# Patient Record
Sex: Female | Born: 1965 | Race: Black or African American | Hispanic: No | Marital: Single | Smoking: Never smoker
Health system: Southern US, Community
[De-identification: ages and names within clinical notes are randomized; demographics above are authoritative.]

## PROBLEM LIST (undated history)

## (undated) DIAGNOSIS — K219 Gastro-esophageal reflux disease without esophagitis: Secondary | ICD-10-CM

## (undated) DIAGNOSIS — I10 Essential (primary) hypertension: Secondary | ICD-10-CM

## (undated) DIAGNOSIS — J45909 Unspecified asthma, uncomplicated: Secondary | ICD-10-CM

## (undated) HISTORY — PX: TUBAL LIGATION: SHX77

## (undated) HISTORY — PX: NO PAST SURGERIES: SHX2092

---

## 1998-02-25 ENCOUNTER — Ambulatory Visit (HOSPITAL_COMMUNITY): Admission: RE | Admit: 1998-02-25 | Discharge: 1998-02-25 | Payer: Self-pay | Admitting: Obstetrics and Gynecology

## 1998-04-01 ENCOUNTER — Ambulatory Visit (HOSPITAL_COMMUNITY): Admission: RE | Admit: 1998-04-01 | Discharge: 1998-04-01 | Payer: Self-pay | Admitting: Obstetrics and Gynecology

## 1998-04-10 ENCOUNTER — Other Ambulatory Visit: Admission: RE | Admit: 1998-04-10 | Discharge: 1998-04-10 | Payer: Self-pay | Admitting: Obstetrics and Gynecology

## 1998-07-07 ENCOUNTER — Other Ambulatory Visit: Admission: RE | Admit: 1998-07-07 | Discharge: 1998-07-07 | Payer: Self-pay | Admitting: Obstetrics and Gynecology

## 1998-07-20 ENCOUNTER — Inpatient Hospital Stay (HOSPITAL_COMMUNITY): Admission: AD | Admit: 1998-07-20 | Discharge: 1998-07-20 | Payer: Self-pay | Admitting: Obstetrics and Gynecology

## 1998-07-21 ENCOUNTER — Inpatient Hospital Stay (HOSPITAL_COMMUNITY): Admission: AD | Admit: 1998-07-21 | Discharge: 1998-07-23 | Payer: Self-pay | Admitting: Obstetrics and Gynecology

## 1998-09-09 ENCOUNTER — Inpatient Hospital Stay (HOSPITAL_COMMUNITY): Admission: AD | Admit: 1998-09-09 | Discharge: 1998-09-09 | Payer: Self-pay | Admitting: Obstetrics and Gynecology

## 1999-02-02 ENCOUNTER — Other Ambulatory Visit: Admission: RE | Admit: 1999-02-02 | Discharge: 1999-02-02 | Payer: Self-pay | Admitting: Family Medicine

## 1999-08-13 ENCOUNTER — Emergency Department (HOSPITAL_COMMUNITY): Admission: EM | Admit: 1999-08-13 | Discharge: 1999-08-13 | Payer: Self-pay | Admitting: Emergency Medicine

## 2001-02-09 ENCOUNTER — Emergency Department (HOSPITAL_COMMUNITY): Admission: EM | Admit: 2001-02-09 | Discharge: 2001-02-10 | Payer: Self-pay | Admitting: Emergency Medicine

## 2001-10-30 ENCOUNTER — Other Ambulatory Visit: Admission: RE | Admit: 2001-10-30 | Discharge: 2001-10-30 | Payer: Self-pay | Admitting: *Deleted

## 2001-11-01 ENCOUNTER — Encounter: Admission: RE | Admit: 2001-11-01 | Discharge: 2001-11-01 | Payer: Self-pay | Admitting: *Deleted

## 2001-11-01 ENCOUNTER — Encounter: Payer: Self-pay | Admitting: *Deleted

## 2005-02-21 ENCOUNTER — Other Ambulatory Visit: Admission: RE | Admit: 2005-02-21 | Discharge: 2005-02-21 | Payer: Self-pay | Admitting: Obstetrics and Gynecology

## 2005-06-21 ENCOUNTER — Other Ambulatory Visit: Admission: RE | Admit: 2005-06-21 | Discharge: 2005-06-21 | Payer: Self-pay | Admitting: Obstetrics and Gynecology

## 2005-12-23 ENCOUNTER — Encounter: Admission: RE | Admit: 2005-12-23 | Discharge: 2005-12-23 | Payer: Self-pay | Admitting: Family Medicine

## 2011-08-30 ENCOUNTER — Inpatient Hospital Stay (INDEPENDENT_AMBULATORY_CARE_PROVIDER_SITE_OTHER)
Admission: RE | Admit: 2011-08-30 | Discharge: 2011-08-30 | Disposition: A | Discharge status: Home / Self Care | Payer: Self-pay | Source: Ambulatory Visit | Attending: Family Medicine | Admitting: Family Medicine

## 2011-08-30 DIAGNOSIS — M62838 Other muscle spasm: Secondary | ICD-10-CM

## 2011-08-30 DIAGNOSIS — S8000XA Contusion of unspecified knee, initial encounter: Secondary | ICD-10-CM

## 2011-09-15 ENCOUNTER — Inpatient Hospital Stay (INDEPENDENT_AMBULATORY_CARE_PROVIDER_SITE_OTHER)
Admission: RE | Admit: 2011-09-15 | Discharge: 2011-09-15 | Disposition: A | Discharge status: Home / Self Care | Payer: Self-pay | Source: Ambulatory Visit | Attending: Family Medicine | Admitting: Family Medicine

## 2011-09-15 DIAGNOSIS — M542 Cervicalgia: Secondary | ICD-10-CM

## 2011-11-02 ENCOUNTER — Other Ambulatory Visit: Payer: Self-pay | Admitting: Obstetrics and Gynecology

## 2011-11-02 DIAGNOSIS — N63 Unspecified lump in unspecified breast: Secondary | ICD-10-CM

## 2011-11-16 ENCOUNTER — Ambulatory Visit
Admission: RE | Admit: 2011-11-16 | Discharge: 2011-11-16 | Disposition: A | Discharge status: Home / Self Care | Payer: 59 | Source: Ambulatory Visit | Attending: Obstetrics and Gynecology | Admitting: Obstetrics and Gynecology

## 2011-11-16 DIAGNOSIS — N63 Unspecified lump in unspecified breast: Secondary | ICD-10-CM

## 2012-01-25 ENCOUNTER — Other Ambulatory Visit: Payer: Self-pay | Admitting: Neurosurgery

## 2012-01-25 DIAGNOSIS — S129XXA Fracture of neck, unspecified, initial encounter: Secondary | ICD-10-CM

## 2012-01-31 ENCOUNTER — Other Ambulatory Visit: Payer: Self-pay | Admitting: Obstetrics and Gynecology

## 2012-01-31 DIAGNOSIS — Z1231 Encounter for screening mammogram for malignant neoplasm of breast: Secondary | ICD-10-CM

## 2012-02-13 ENCOUNTER — Ambulatory Visit: Payer: Managed Care, Other (non HMO)

## 2012-03-01 ENCOUNTER — Ambulatory Visit
Admission: RE | Admit: 2012-03-01 | Discharge: 2012-03-01 | Disposition: A | Discharge status: Home / Self Care | Payer: Managed Care, Other (non HMO) | Source: Ambulatory Visit | Attending: Obstetrics and Gynecology | Admitting: Obstetrics and Gynecology

## 2012-03-01 DIAGNOSIS — Z1231 Encounter for screening mammogram for malignant neoplasm of breast: Secondary | ICD-10-CM

## 2012-04-06 ENCOUNTER — Other Ambulatory Visit: Payer: Managed Care, Other (non HMO)

## 2012-05-26 ENCOUNTER — Encounter (HOSPITAL_COMMUNITY): Payer: Self-pay | Admitting: Emergency Medicine

## 2012-05-26 ENCOUNTER — Emergency Department (HOSPITAL_COMMUNITY)
Admission: EM | Admit: 2012-05-26 | Discharge: 2012-05-27 | Disposition: A | Discharge status: Home / Self Care | Payer: Managed Care, Other (non HMO) | Attending: Emergency Medicine | Admitting: Emergency Medicine

## 2012-05-26 DIAGNOSIS — R1110 Vomiting, unspecified: Secondary | ICD-10-CM

## 2012-05-26 DIAGNOSIS — E860 Dehydration: Secondary | ICD-10-CM | POA: Insufficient documentation

## 2012-05-26 DIAGNOSIS — R197 Diarrhea, unspecified: Secondary | ICD-10-CM | POA: Insufficient documentation

## 2012-05-26 DIAGNOSIS — E876 Hypokalemia: Secondary | ICD-10-CM | POA: Insufficient documentation

## 2012-05-26 DIAGNOSIS — R29898 Other symptoms and signs involving the musculoskeletal system: Secondary | ICD-10-CM | POA: Insufficient documentation

## 2012-05-26 DIAGNOSIS — R112 Nausea with vomiting, unspecified: Secondary | ICD-10-CM | POA: Insufficient documentation

## 2012-05-26 DIAGNOSIS — R509 Fever, unspecified: Secondary | ICD-10-CM | POA: Insufficient documentation

## 2012-05-26 DIAGNOSIS — R209 Unspecified disturbances of skin sensation: Secondary | ICD-10-CM | POA: Insufficient documentation

## 2012-05-26 DIAGNOSIS — I10 Essential (primary) hypertension: Secondary | ICD-10-CM | POA: Insufficient documentation

## 2012-05-26 HISTORY — DX: Essential (primary) hypertension: I10

## 2012-05-26 HISTORY — DX: Gastro-esophageal reflux disease without esophagitis: K21.9

## 2012-05-26 NOTE — ED Notes (Signed)
Pt c/o n/v x 3 since 0100/d + 10 episodes since 0100. Pt states feeling weakness and unable to keep anything down.

## 2012-05-26 NOTE — ED Notes (Signed)
Pt c/o NVD that began late last Friday night. Pt states she went out with friends and had a few drinks as well as some wings. Pt states she woke up in the middle of the night with diarrhea. Vomiting began Saturday afternoon. Pt states she feels weak. Pt has not been able to eat today due to nausea but can keep fluids down.

## 2012-05-27 LAB — POCT I-STAT TROPONIN I: Troponin i, poc: 0 ng/mL (ref 0.00–0.08)

## 2012-05-27 LAB — CBC
Platelets: 236 10*3/uL (ref 150–400)
RBC: 4.39 MIL/uL (ref 3.87–5.11)
WBC: 9.7 10*3/uL (ref 4.0–10.5)

## 2012-05-27 LAB — DIFFERENTIAL
Eosinophils Absolute: 0.1 10*3/uL (ref 0.0–0.7)
Lymphocytes Relative: 12 % (ref 12–46)
Lymphs Abs: 1.2 10*3/uL (ref 0.7–4.0)
Neutrophils Relative %: 80 % — ABNORMAL HIGH (ref 43–77)

## 2012-05-27 LAB — BASIC METABOLIC PANEL
CO2: 21 mEq/L (ref 19–32)
GFR calc non Af Amer: 87 mL/min — ABNORMAL LOW (ref >90)
Glucose, Bld: 110 mg/dL — ABNORMAL HIGH (ref 70–99)
Potassium: 3.1 mEq/L — ABNORMAL LOW (ref 3.5–5.1)
Sodium: 138 mEq/L (ref 135–145)

## 2012-05-27 MED ORDER — SODIUM CHLORIDE 0.9 % IV BOLUS (SEPSIS)
1000.0000 mL | Freq: Once | INTRAVENOUS | Status: AC
  Administered 2012-05-27: 1000 mL via INTRAVENOUS

## 2012-05-27 MED ORDER — PROMETHAZINE HCL 25 MG PO TABS: 25.0000 mg | ORAL_TABLET | Freq: Four times a day (QID) | ORAL | Status: DC | PRN

## 2012-05-27 MED ORDER — ONDANSETRON HCL 4 MG/2ML IJ SOLN
4.0000 mg | Freq: Once | INTRAMUSCULAR | Status: AC
  Administered 2012-05-27: 4 mg via INTRAVENOUS
  Filled 2012-05-27: qty 2

## 2012-05-27 MED ORDER — LOPERAMIDE HCL 2 MG PO CAPS
4.0000 mg | ORAL_CAPSULE | Freq: Once | ORAL | Status: AC
  Administered 2012-05-27: 4 mg via ORAL
  Filled 2012-05-27: qty 2

## 2012-05-27 MED ORDER — POTASSIUM CHLORIDE ER 10 MEQ PO TBCR: 10.0000 meq | EXTENDED_RELEASE_TABLET | Freq: Two times a day (BID) | ORAL | Status: DC

## 2012-05-27 MED ORDER — SODIUM CHLORIDE 0.9 % IV SOLN
INTRAVENOUS | Status: DC
  Administered 2012-05-27 (×2): 125 mL/h via INTRAVENOUS

## 2012-05-27 MED ORDER — POTASSIUM CHLORIDE CRYS ER 20 MEQ PO TBCR
40.0000 meq | EXTENDED_RELEASE_TABLET | Freq: Once | ORAL | Status: AC
  Administered 2012-05-27: 40 meq via ORAL
  Filled 2012-05-27: qty 2

## 2012-05-27 NOTE — Discharge Instructions (Signed)
Drink plenty of fluids (clear liquids) the next 12-24 hours then start the BRAT diet. . Use the phenergan for nausea or vomiting. Take imodium OTC for diarrhea. Avoid mild products until the diarrhea is gone. Recheck if you get worse.  
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
## 2013-04-19 ENCOUNTER — Other Ambulatory Visit: Payer: Self-pay

## 2013-04-19 DIAGNOSIS — Z1231 Encounter for screening mammogram for malignant neoplasm of breast: Secondary | ICD-10-CM

## 2013-05-07 ENCOUNTER — Ambulatory Visit: Payer: Managed Care, Other (non HMO)

## 2013-05-09 ENCOUNTER — Ambulatory Visit
Admission: RE | Admit: 2013-05-09 | Discharge: 2013-05-09 | Disposition: A | Discharge status: Home / Self Care | Payer: Managed Care, Other (non HMO) | Source: Ambulatory Visit

## 2013-05-09 DIAGNOSIS — Z1231 Encounter for screening mammogram for malignant neoplasm of breast: Secondary | ICD-10-CM

## 2013-12-13 ENCOUNTER — Encounter (HOSPITAL_COMMUNITY): Payer: Self-pay | Admitting: Emergency Medicine

## 2013-12-13 ENCOUNTER — Emergency Department (HOSPITAL_COMMUNITY)
Admission: EM | Admit: 2013-12-13 | Discharge: 2013-12-14 | Disposition: A | Discharge status: Home / Self Care | Payer: Managed Care, Other (non HMO) | Attending: Emergency Medicine | Admitting: Emergency Medicine

## 2013-12-13 ENCOUNTER — Emergency Department (HOSPITAL_COMMUNITY): Payer: Managed Care, Other (non HMO)

## 2013-12-13 DIAGNOSIS — S0993XA Unspecified injury of face, initial encounter: Secondary | ICD-10-CM | POA: Diagnosis not present

## 2013-12-13 DIAGNOSIS — R42 Dizziness and giddiness: Secondary | ICD-10-CM | POA: Insufficient documentation

## 2013-12-13 DIAGNOSIS — K219 Gastro-esophageal reflux disease without esophagitis: Secondary | ICD-10-CM | POA: Diagnosis not present

## 2013-12-13 DIAGNOSIS — I10 Essential (primary) hypertension: Secondary | ICD-10-CM | POA: Insufficient documentation

## 2013-12-13 DIAGNOSIS — Z79899 Other long term (current) drug therapy: Secondary | ICD-10-CM | POA: Insufficient documentation

## 2013-12-13 DIAGNOSIS — Y92410 Unspecified street and highway as the place of occurrence of the external cause: Secondary | ICD-10-CM | POA: Insufficient documentation

## 2013-12-13 DIAGNOSIS — S139XXA Sprain of joints and ligaments of unspecified parts of neck, initial encounter: Secondary | ICD-10-CM | POA: Insufficient documentation

## 2013-12-13 DIAGNOSIS — Y9389 Activity, other specified: Secondary | ICD-10-CM | POA: Insufficient documentation

## 2013-12-13 DIAGNOSIS — IMO0002 Reserved for concepts with insufficient information to code with codable children: Secondary | ICD-10-CM | POA: Diagnosis present

## 2013-12-13 DIAGNOSIS — S161XXA Strain of muscle, fascia and tendon at neck level, initial encounter: Secondary | ICD-10-CM

## 2013-12-13 MED ORDER — HYDROCODONE-ACETAMINOPHEN 5-325 MG PO TABS: 1.0000 | ORAL_TABLET | Freq: Four times a day (QID) | ORAL | Status: DC | PRN

## 2013-12-13 MED ORDER — DIAZEPAM 5 MG PO TABS
10.0000 mg | ORAL_TABLET | Freq: Once | ORAL | Status: AC
  Administered 2013-12-13: 10 mg via ORAL
  Filled 2013-12-13: qty 2

## 2013-12-13 MED ORDER — METHOCARBAMOL 750 MG PO TABS: 750.0000 mg | ORAL_TABLET | Freq: Four times a day (QID) | ORAL | Status: DC | PRN

## 2013-12-13 MED ORDER — NAPROXEN 500 MG PO TABS: 500.0000 mg | ORAL_TABLET | Freq: Two times a day (BID) | ORAL | Status: DC | PRN
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
## 2013-12-14 NOTE — ED Provider Notes (Signed)
Medical screening examination/treatment/procedure(s) were performed by non-physician practitioner and as supervising physician I was immediately available for consultation/collaboration.  EKG Interpretation   None         Jeyden Coffelt N Kashonda Sarkisyan, DO 12/14/13 0033 

## 2014-01-17 ENCOUNTER — Other Ambulatory Visit: Payer: Self-pay | Admitting: Obstetrics and Gynecology

## 2014-01-17 DIAGNOSIS — N644 Mastodynia: Secondary | ICD-10-CM

## 2014-01-21 ENCOUNTER — Other Ambulatory Visit: Payer: Managed Care, Other (non HMO)

## 2014-02-06 ENCOUNTER — Ambulatory Visit
Admission: RE | Admit: 2014-02-06 | Discharge: 2014-02-06 | Disposition: A | Discharge status: Home / Self Care | Payer: Self-pay | Source: Ambulatory Visit | Attending: Obstetrics and Gynecology | Admitting: Obstetrics and Gynecology

## 2014-02-06 ENCOUNTER — Ambulatory Visit
Admission: RE | Admit: 2014-02-06 | Discharge: 2014-02-06 | Disposition: A | Discharge status: Home / Self Care | Payer: Managed Care, Other (non HMO) | Source: Ambulatory Visit | Attending: Obstetrics and Gynecology | Admitting: Obstetrics and Gynecology

## 2014-02-06 DIAGNOSIS — N644 Mastodynia: Secondary | ICD-10-CM

## 2014-08-01 ENCOUNTER — Other Ambulatory Visit: Payer: Self-pay

## 2014-08-01 DIAGNOSIS — Z1231 Encounter for screening mammogram for malignant neoplasm of breast: Secondary | ICD-10-CM

## 2014-08-06 ENCOUNTER — Ambulatory Visit
Admission: RE | Admit: 2014-08-06 | Discharge: 2014-08-06 | Disposition: A | Discharge status: Home / Self Care | Payer: Managed Care, Other (non HMO) | Source: Ambulatory Visit

## 2014-08-06 DIAGNOSIS — Z1231 Encounter for screening mammogram for malignant neoplasm of breast: Secondary | ICD-10-CM

## 2014-08-08 ENCOUNTER — Other Ambulatory Visit: Payer: Self-pay | Admitting: Obstetrics and Gynecology

## 2014-08-08 DIAGNOSIS — R928 Other abnormal and inconclusive findings on diagnostic imaging of breast: Secondary | ICD-10-CM

## 2014-08-18 ENCOUNTER — Ambulatory Visit
Admission: RE | Admit: 2014-08-18 | Discharge: 2014-08-18 | Disposition: A | Discharge status: Home / Self Care | Payer: Managed Care, Other (non HMO) | Source: Ambulatory Visit | Attending: Obstetrics and Gynecology | Admitting: Obstetrics and Gynecology

## 2014-08-18 DIAGNOSIS — R928 Other abnormal and inconclusive findings on diagnostic imaging of breast: Secondary | ICD-10-CM

## 2015-07-21 ENCOUNTER — Other Ambulatory Visit: Payer: Self-pay

## 2015-07-21 ENCOUNTER — Other Ambulatory Visit (HOSPITAL_COMMUNITY)
Admission: RE | Admit: 2015-07-21 | Discharge: 2015-07-21 | Disposition: A | Discharge status: Home / Self Care | Payer: BC Managed Care – PPO | Source: Ambulatory Visit | Attending: Internal Medicine | Admitting: Internal Medicine

## 2015-07-21 DIAGNOSIS — Z1231 Encounter for screening mammogram for malignant neoplasm of breast: Secondary | ICD-10-CM

## 2015-07-21 DIAGNOSIS — Z01419 Encounter for gynecological examination (general) (routine) without abnormal findings: Secondary | ICD-10-CM | POA: Diagnosis present

## 2015-07-22 ENCOUNTER — Other Ambulatory Visit: Payer: Self-pay | Admitting: Internal Medicine

## 2015-07-22 DIAGNOSIS — R10819 Abdominal tenderness, unspecified site: Secondary | ICD-10-CM

## 2015-07-28 ENCOUNTER — Ambulatory Visit
Admission: RE | Admit: 2015-07-28 | Discharge: 2015-07-28 | Disposition: A | Discharge status: Home / Self Care | Payer: BC Managed Care – PPO | Source: Ambulatory Visit | Attending: Internal Medicine | Admitting: Internal Medicine

## 2015-07-28 DIAGNOSIS — R10819 Abdominal tenderness, unspecified site: Secondary | ICD-10-CM

## 2015-08-25 ENCOUNTER — Ambulatory Visit: Payer: Managed Care, Other (non HMO)

## 2015-08-25 ENCOUNTER — Ambulatory Visit
Admission: RE | Admit: 2015-08-25 | Discharge: 2015-08-25 | Disposition: A | Discharge status: Home / Self Care | Payer: BC Managed Care – PPO | Source: Ambulatory Visit

## 2015-08-25 DIAGNOSIS — Z1231 Encounter for screening mammogram for malignant neoplasm of breast: Secondary | ICD-10-CM

## 2016-04-12 DIAGNOSIS — I10 Essential (primary) hypertension: Secondary | ICD-10-CM | POA: Insufficient documentation

## 2016-04-12 DIAGNOSIS — K219 Gastro-esophageal reflux disease without esophagitis: Secondary | ICD-10-CM | POA: Insufficient documentation

## 2016-04-14 ENCOUNTER — Ambulatory Visit (INDEPENDENT_AMBULATORY_CARE_PROVIDER_SITE_OTHER): Payer: BC Managed Care – PPO

## 2016-04-14 DIAGNOSIS — I10 Essential (primary) hypertension: Secondary | ICD-10-CM

## 2016-04-14 DIAGNOSIS — R079 Chest pain, unspecified: Secondary | ICD-10-CM

## 2016-04-14 LAB — EXERCISE TOLERANCE TEST
CHL CUP STRESS STAGE 1 DBP: 77 mmHg
CHL CUP STRESS STAGE 1 HR: 95 {beats}/min
CHL CUP STRESS STAGE 3 HR: 89 {beats}/min
CHL CUP STRESS STAGE 3 SPEED: 1 mph
CHL CUP STRESS STAGE 4 GRADE: 10 %
CHL CUP STRESS STAGE 4 HR: 134 {beats}/min
CHL CUP STRESS STAGE 4 SBP: 166 mmHg
CHL CUP STRESS STAGE 4 SPEED: 1.7 mph
CHL CUP STRESS STAGE 5 GRADE: 12 %
CHL CUP STRESS STAGE 5 HR: 155 {beats}/min
CHL CUP STRESS STAGE 6 HR: 173 {beats}/min
CHL CUP STRESS STAGE 7 SPEED: 0 mph
CHL CUP STRESS STAGE 8 GRADE: 0 %
CHL CUP STRESS STAGE 8 HR: 112 {beats}/min
Estimated workload: 10 METS
Exercise duration (min): 8 min
Exercise duration (sec): 0 s
MPHR: 172 {beats}/min
Peak HR: 173 {beats}/min
Percent HR: 100 %
Percent of predicted max HR: 100 %
RPE: 17
Rest HR: 78 {beats}/min
Stage 1 Grade: 0 %
Stage 1 SBP: 125 mmHg
Stage 1 Speed: 0 mph
Stage 2 Grade: 0 %
Stage 2 HR: 93 {beats}/min
Stage 2 Speed: 1 mph
Stage 3 Grade: 0 %
Stage 4 DBP: 104 mmHg
Stage 5 DBP: 106 mmHg
Stage 5 SBP: 178 mmHg
Stage 5 Speed: 2.5 mph
Stage 6 Grade: 14 %
Stage 6 Speed: 3.4 mph
Stage 7 Grade: 0 %
Stage 7 HR: 146 {beats}/min
Stage 8 DBP: 85 mmHg
Stage 8 SBP: 132 mmHg
Stage 8 Speed: 0 mph

## 2016-04-21 ENCOUNTER — Telehealth (HOSPITAL_COMMUNITY): Payer: Self-pay | Admitting: *Deleted

## 2016-04-21 ENCOUNTER — Ambulatory Visit (HOSPITAL_COMMUNITY): Payer: BC Managed Care – PPO

## 2016-04-21 NOTE — Telephone Encounter (Signed)
Left message on voicemail in reference to upcoming appointment scheduled for 04/26/16  Phone number given for a call back so details instructions can be given. Agapito Hanway J Nagee Goates, RN 

## 2016-04-26 ENCOUNTER — Other Ambulatory Visit (HOSPITAL_COMMUNITY): Payer: Self-pay | Admitting: Internal Medicine

## 2016-04-26 ENCOUNTER — Ambulatory Visit (HOSPITAL_COMMUNITY): Payer: BC Managed Care – PPO | Attending: Internal Medicine

## 2016-04-26 DIAGNOSIS — I10 Essential (primary) hypertension: Secondary | ICD-10-CM | POA: Diagnosis not present

## 2016-04-26 DIAGNOSIS — R9431 Abnormal electrocardiogram [ECG] [EKG]: Secondary | ICD-10-CM

## 2016-04-26 DIAGNOSIS — R079 Chest pain, unspecified: Secondary | ICD-10-CM | POA: Insufficient documentation

## 2016-04-26 LAB — MYOCARDIAL PERFUSION IMAGING
CHL CUP NUCLEAR SDS: 1
CHL CUP NUCLEAR SSS: 4
LV dias vol: 88 mL (ref 46–106)
LV sys vol: 36 mL
Peak HR: 115 {beats}/min
RATE: 0.32
Rest HR: 60 {beats}/min
SRS: 3
TID: 1.03

## 2016-04-26 MED ORDER — TECHNETIUM TC 99M SESTAMIBI GENERIC - CARDIOLITE
11.0000 | Freq: Once | INTRAVENOUS | Status: AC | PRN
  Administered 2016-04-26: 11 via INTRAVENOUS

## 2016-04-26 MED ORDER — TECHNETIUM TC 99M SESTAMIBI GENERIC - CARDIOLITE
32.6000 | Freq: Once | INTRAVENOUS | Status: AC | PRN
  Administered 2016-04-26: 33 via INTRAVENOUS

## 2016-04-26 MED ORDER — REGADENOSON 0.4 MG/5ML IV SOLN
0.4000 mg | Freq: Once | INTRAVENOUS | Status: AC
  Administered 2016-04-26: 0.4 mg via INTRAVENOUS

## 2016-05-26 ENCOUNTER — Other Ambulatory Visit: Payer: Self-pay | Admitting: Obstetrics and Gynecology

## 2016-05-27 LAB — CYTOLOGY - PAP

## 2016-07-15 ENCOUNTER — Other Ambulatory Visit: Payer: Self-pay | Admitting: Obstetrics and Gynecology

## 2016-07-15 DIAGNOSIS — Z1231 Encounter for screening mammogram for malignant neoplasm of breast: Secondary | ICD-10-CM

## 2016-08-25 ENCOUNTER — Ambulatory Visit
Admission: RE | Admit: 2016-08-25 | Discharge: 2016-08-25 | Disposition: A | Discharge status: Home / Self Care | Payer: BC Managed Care – PPO | Source: Ambulatory Visit | Attending: Obstetrics and Gynecology | Admitting: Obstetrics and Gynecology

## 2016-08-25 DIAGNOSIS — Z1231 Encounter for screening mammogram for malignant neoplasm of breast: Secondary | ICD-10-CM

## 2016-12-18 ENCOUNTER — Encounter (HOSPITAL_COMMUNITY): Payer: Self-pay

## 2016-12-18 ENCOUNTER — Ambulatory Visit (HOSPITAL_COMMUNITY)
Admission: EM | Admit: 2016-12-18 | Discharge: 2016-12-18 | Disposition: A | Discharge status: Home / Self Care | Payer: BC Managed Care – PPO | Attending: Family Medicine | Admitting: Family Medicine

## 2016-12-18 DIAGNOSIS — R197 Diarrhea, unspecified: Secondary | ICD-10-CM | POA: Diagnosis not present

## 2016-12-18 DIAGNOSIS — R05 Cough: Secondary | ICD-10-CM

## 2016-12-18 DIAGNOSIS — R059 Cough, unspecified: Secondary | ICD-10-CM

## 2016-12-18 DIAGNOSIS — R112 Nausea with vomiting, unspecified: Secondary | ICD-10-CM

## 2016-12-18 MED ORDER — ONDANSETRON 8 MG PO TBDP: 8.0000 mg | ORAL_TABLET | Freq: Three times a day (TID) | ORAL | 0 refills | Status: DC | PRN

## 2016-12-18 MED ORDER — HYDROCODONE-HOMATROPINE 5-1.5 MG/5ML PO SYRP: 5.0000 mL | ORAL_SOLUTION | Freq: Four times a day (QID) | ORAL | 0 refills | Status: DC | PRN

## 2016-12-18 MED ORDER — AZITHROMYCIN 250 MG PO TABS: 250.0000 mg | ORAL_TABLET | Freq: Every day | ORAL | 0 refills | Status: DC

## 2016-12-18 NOTE — ED Provider Notes (Signed)
[facility]    CSN: [id number] Arrival date & time: 12/18/16  1337     History   Chief Complaint Chief Complaint  Patient presents with  . Emesis    HPI Christy Schultz is a 50 y.o. female.   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 She was diagnosed recently with asthma although she's never had that before. She was given an inhaler.  She doesn't have much to eat last couple days is having some dry heaves.  Patient denies fever, rash, sore throat, or abdominal pain      Past Medical History:  Diagnosis Date  . GERD (gastroesophageal reflux disease)   . Hypertension     Patient Active Problem List   Diagnosis Date Noted  . Hypertension   . GERD (gastroesophageal reflux disease)     Past Surgical History:  Procedure Laterality Date  . NO PAST SURGERIES      OB History    No data available       Home Medications    Prior to Admission medications   Medication Sig Start Date End Date Taking? Authorizing Provider  lisinopril-hydrochlorothiazide (PRINZIDE,ZESTORETIC) 20-12.5 MG per tablet Take 1 tablet by mouth daily.   Yes Historical Provider, MD  omeprazole (PRILOSEC) 20 MG capsule Take 20 mg by mouth daily.   Yes Historical Provider, MD  azithromycin (ZITHROMAX) 250 MG tablet Take 1 tablet (250 mg total) by mouth daily. Take first 2 tablets together, then 1 every day until finished. 12/18/16   Elvina SidleKurt Karina Lenderman, MD  HYDROcodone-homatropine [facility](HYCODAN) 5-1.5 MG/5ML syrup Take 5 mLs by mouth every 6 (six) hours as needed for cough. 12/18/16   Elvina SidleKurt Kaleiah Kutzer, MD  ondansetron (ZOFRAN-ODT) 8 MG disintegrating tablet Take 1 tablet (8 mg total) by mouth every 8 (eight) hours as needed for nausea. 12/18/16   Elvina SidleKurt Bonnie Overdorf, MD  valACYclovir (VALTREX) 500 MG tablet Take 500 mg by mouth daily.    Historical Provider, MD    Family History No family history on file.  Social History Social  History  Substance Use Topics  . Smoking status: Never Smoker  . Smokeless tobacco: Never Used  . Alcohol use Yes     Comment: occassional     Allergies   Patient has no known allergies.   Review of Systems Review of Systems  Constitutional: Negative.   HENT: Negative.   Respiratory: Positive for cough.   Gastrointestinal: Positive for diarrhea, nausea and vomiting. Negative for abdominal pain.  Genitourinary: Negative.   Musculoskeletal: Positive for myalgias.  Neurological: Positive for headaches.     Physical Exam Triage Vital Signs ED Triage Vitals  Enc Vitals Group     BP 12/18/16 1350 133/91     Pulse Rate 12/18/16 1350 105     Resp 12/18/16 1350 16     Temp 12/18/16 1350 98.4 F (36.9 C)     Temp Source 12/18/16 1350 Oral     SpO2 12/18/16 1350 100 %     Weight --      Height --      Head Circumference --      Peak Flow --      Pain Score 12/18/16 1351 10     Pain Loc --      Pain Edu? --      Excl. in GC? --    No data found.   Updated Vital Signs BP 133/91 (BP Location: Left Arm)  Pulse 105   Temp 98.4 F (36.9 C) (Oral)   Resp 16   SpO2 100%    Physical Exam  Constitutional: She is oriented to person, place, and time. She appears well-developed and well-nourished.  HENT:  Head: Normocephalic.  Right Ear: External ear normal.  Left Ear: External ear normal.  Mouth/Throat: Oropharynx is clear and moist.  Eyes: Conjunctivae and EOM are normal.  Neck: Normal range of motion. Neck supple.  Cardiovascular: Normal rate, regular rhythm and normal heart sounds.   Pulmonary/Chest: Effort normal and breath sounds normal.  Abdominal: Soft. Bowel sounds are normal.  Musculoskeletal: Normal range of motion.  Neurological: She is alert and oriented to person, place, and time.  Skin: Skin is warm and dry.  Nursing note and vitals reviewed.    UC Treatments / Results  Labs (all labs ordered are listed, but only abnormal results are  displayed) Labs Reviewed - No data to display  EKG  EKG Interpretation None       Radiology No results found.  Procedures Procedures (including critical care time)  Medications Ordered in UC Medications - No data to display   Initial Impression / Assessment and Plan / UC Course  I have reviewed the triage vital signs and the nursing notes.  Pertinent labs & imaging results that were available during my care of the patient were reviewed by me and considered in my medical decision making (see chart for details).  Clinical Course     Final Clinical Impressions(s) / UC Diagnoses   Final diagnoses:  Nausea vomiting and diarrhea  Cough    New Prescriptions New Prescriptions   AZITHROMYCIN (ZITHROMAX) 250 MG TABLET    Take 1 tablet (250 mg total) by mouth daily. Take first 2 tablets together, then 1 every day until finished.   HYDROCODONE-HOMATROPINE (HYCODAN) 5-1.5 MG/5ML SYRUP    Take 5 mLs by mouth every 6 (six) hours as needed for cough.   ONDANSETRON (ZOFRAN-ODT) 8 MG DISINTEGRATING TABLET    Take 1 tablet (8 mg total) by mouth every 8 (eight) hours as needed for nausea.     Elvina SidleKurt Ahmeer Tuman, MD 12/18/16 [phone]

## 2016-12-18 NOTE — ED Triage Notes (Signed)
Pt said she has been feeling bad since wed. Has a real bad cough, diarrhea and vomitting. No fever reported. Was taking mucinex and cough medication.

## 2016-12-30 ENCOUNTER — Encounter (HOSPITAL_COMMUNITY): Payer: Self-pay | Admitting: Emergency Medicine

## 2016-12-30 ENCOUNTER — Emergency Department (HOSPITAL_COMMUNITY)
Admission: EM | Admit: 2016-12-30 | Discharge: 2016-12-30 | Disposition: A | Discharge status: Home / Self Care | Payer: No Typology Code available for payment source | Attending: Emergency Medicine | Admitting: Emergency Medicine

## 2016-12-30 DIAGNOSIS — J45909 Unspecified asthma, uncomplicated: Secondary | ICD-10-CM | POA: Insufficient documentation

## 2016-12-30 DIAGNOSIS — I10 Essential (primary) hypertension: Secondary | ICD-10-CM | POA: Insufficient documentation

## 2016-12-30 DIAGNOSIS — M542 Cervicalgia: Secondary | ICD-10-CM | POA: Diagnosis not present

## 2016-12-30 DIAGNOSIS — Y939 Activity, unspecified: Secondary | ICD-10-CM | POA: Insufficient documentation

## 2016-12-30 DIAGNOSIS — Y92410 Unspecified street and highway as the place of occurrence of the external cause: Secondary | ICD-10-CM | POA: Insufficient documentation

## 2016-12-30 DIAGNOSIS — Z79899 Other long term (current) drug therapy: Secondary | ICD-10-CM | POA: Insufficient documentation

## 2016-12-30 DIAGNOSIS — M7918 Myalgia, other site: Secondary | ICD-10-CM

## 2016-12-30 DIAGNOSIS — Y999 Unspecified external cause status: Secondary | ICD-10-CM | POA: Diagnosis not present

## 2016-12-30 HISTORY — DX: Unspecified asthma, uncomplicated: J45.909

## 2016-12-30 MED ORDER — IBUPROFEN 600 MG PO TABS: 600.0000 mg | ORAL_TABLET | Freq: Four times a day (QID) | ORAL | 0 refills | Status: DC | PRN

## 2016-12-30 MED ORDER — METHOCARBAMOL 500 MG PO TABS: 500.0000 mg | ORAL_TABLET | Freq: Two times a day (BID) | ORAL | 0 refills | Status: DC

## 2016-12-30 MED ORDER — METHOCARBAMOL 500 MG PO TABS
500.0000 mg | ORAL_TABLET | Freq: Once | ORAL | Status: AC
Start: 2016-12-30 — End: 2016-12-30
  Administered 2016-12-30: 500 mg via ORAL
  Filled 2016-12-30: qty 1

## 2016-12-30 MED ORDER — IBUPROFEN 200 MG PO TABS
600.0000 mg | ORAL_TABLET | Freq: Once | ORAL | Status: AC
  Administered 2016-12-30: 600 mg via ORAL
  Filled 2016-12-30: qty 3
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
## 2017-01-30 ENCOUNTER — Encounter (INDEPENDENT_AMBULATORY_CARE_PROVIDER_SITE_OTHER): Payer: Self-pay | Admitting: Orthopedic Surgery

## 2017-01-30 ENCOUNTER — Ambulatory Visit (INDEPENDENT_AMBULATORY_CARE_PROVIDER_SITE_OTHER): Payer: Self-pay

## 2017-01-30 ENCOUNTER — Ambulatory Visit (INDEPENDENT_AMBULATORY_CARE_PROVIDER_SITE_OTHER): Payer: BC Managed Care – PPO | Admitting: Orthopedic Surgery

## 2017-01-30 VITALS — Ht 66.0 in | Wt 230.0 lb

## 2017-01-30 DIAGNOSIS — M79641 Pain in right hand: Secondary | ICD-10-CM | POA: Insufficient documentation
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
## 2017-04-14 ENCOUNTER — Telehealth (INDEPENDENT_AMBULATORY_CARE_PROVIDER_SITE_OTHER): Payer: Self-pay | Admitting: Orthopedic Surgery

## 2017-04-14 NOTE — Telephone Encounter (Signed)
I received request for records from [employer] 03/28/17-but could not process as the authorization was missing patients signature.  I faxed back advising of this.

## 2017-07-28 ENCOUNTER — Other Ambulatory Visit: Payer: Self-pay | Admitting: Obstetrics and Gynecology

## 2017-07-28 DIAGNOSIS — Z1231 Encounter for screening mammogram for malignant neoplasm of breast: Secondary | ICD-10-CM

## 2017-08-30 ENCOUNTER — Ambulatory Visit
Admission: RE | Admit: 2017-08-30 | Discharge: 2017-08-30 | Disposition: A | Discharge status: Home / Self Care | Payer: BC Managed Care – PPO | Source: Ambulatory Visit | Attending: Obstetrics and Gynecology | Admitting: Obstetrics and Gynecology

## 2017-08-30 DIAGNOSIS — Z1231 Encounter for screening mammogram for malignant neoplasm of breast: Secondary | ICD-10-CM

## 2018-02-08 ENCOUNTER — Other Ambulatory Visit: Payer: Self-pay | Admitting: Internal Medicine

## 2018-02-08 DIAGNOSIS — N63 Unspecified lump in unspecified breast: Secondary | ICD-10-CM

## 2018-02-14 ENCOUNTER — Ambulatory Visit
Admission: RE | Admit: 2018-02-14 | Discharge: 2018-02-14 | Disposition: A | Discharge status: Home / Self Care | Payer: BC Managed Care – PPO | Source: Ambulatory Visit | Attending: Internal Medicine | Admitting: Internal Medicine

## 2018-02-14 DIAGNOSIS — N63 Unspecified lump in unspecified breast: Secondary | ICD-10-CM

## 2018-10-01 ENCOUNTER — Other Ambulatory Visit: Payer: Self-pay | Admitting: Obstetrics and Gynecology

## 2018-10-01 DIAGNOSIS — Z1231 Encounter for screening mammogram for malignant neoplasm of breast: Secondary | ICD-10-CM

## 2018-11-05 ENCOUNTER — Ambulatory Visit
Admission: RE | Admit: 2018-11-05 | Discharge: 2018-11-05 | Disposition: A | Discharge status: Home / Self Care | Payer: BC Managed Care – PPO | Source: Ambulatory Visit | Attending: Obstetrics and Gynecology | Admitting: Obstetrics and Gynecology

## 2018-11-05 DIAGNOSIS — Z1231 Encounter for screening mammogram for malignant neoplasm of breast: Secondary | ICD-10-CM

## 2019-06-05 ENCOUNTER — Encounter (HOSPITAL_COMMUNITY): Payer: Self-pay | Admitting: Emergency Medicine

## 2019-06-05 ENCOUNTER — Ambulatory Visit (HOSPITAL_COMMUNITY)
Admission: EM | Admit: 2019-06-05 | Discharge: 2019-06-05 | Disposition: A | Discharge status: Home / Self Care | Payer: BC Managed Care – PPO | Attending: Family Medicine | Admitting: Family Medicine

## 2019-06-05 ENCOUNTER — Other Ambulatory Visit: Payer: Self-pay

## 2019-06-05 DIAGNOSIS — S39012A Strain of muscle, fascia and tendon of lower back, initial encounter: Secondary | ICD-10-CM | POA: Diagnosis not present

## 2019-06-05 MED ORDER — DICLOFENAC SODIUM 75 MG PO TBEC: 75.0000 mg | DELAYED_RELEASE_TABLET | Freq: Two times a day (BID) | ORAL | 0 refills | Status: DC

## 2019-06-05 MED ORDER — CYCLOBENZAPRINE HCL 10 MG PO TABS: ORAL_TABLET | ORAL | 0 refills | Status: DC
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
## 2019-06-05 NOTE — ED Notes (Signed)
Patient able to ambulate independently  

## 2019-09-27 ENCOUNTER — Other Ambulatory Visit: Payer: Self-pay | Admitting: Obstetrics and Gynecology

## 2019-09-27 DIAGNOSIS — Z1231 Encounter for screening mammogram for malignant neoplasm of breast: Secondary | ICD-10-CM

## 2019-10-25 ENCOUNTER — Ambulatory Visit: Payer: BC Managed Care – PPO | Admitting: Cardiology

## 2019-10-25 ENCOUNTER — Encounter: Payer: Self-pay | Admitting: Cardiology

## 2019-10-25 ENCOUNTER — Other Ambulatory Visit: Payer: Self-pay

## 2019-10-25 VITALS — BP 131/81 | HR 69 | Temp 97.3°F | Ht 67.0 in | Wt 219.0 lb

## 2019-10-25 DIAGNOSIS — R0789 Other chest pain: Secondary | ICD-10-CM | POA: Diagnosis not present

## 2019-10-25 DIAGNOSIS — R002 Palpitations: Secondary | ICD-10-CM | POA: Diagnosis not present

## 2019-10-25 DIAGNOSIS — I10 Essential (primary) hypertension: Secondary | ICD-10-CM | POA: Diagnosis not present

## 2019-10-25 NOTE — Progress Notes (Signed)
Primary Physician/Referring:  Thressa Sheller, MD (Inactive)  Patient ID: Christy Schultz, female    DOB: 12-18-1966, 53 y.o.   MRN: [id number]  Chief Complaint  Patient presents with  . New Patient (Initial Visit)  . Palpitations    multiple comorbidities   HPI:    Christy Schultz  is a 54 y.o. African-American female with hypertension, mild obesity, who had COVID-19 in September 2020, she has since been tested and is negative, started having palpitations and chest tightness 2 weeks ago, described as lasting a few seconds, not necessarily associated with any exertional activity.  She has been exercising and jogging every day but stopped since this episode.  She is concerned about COVID-19 and effects on cardiovascular health.  Denies diabetes or hyperlipidemia, she is a non-smoker.  Past Medical History:  Diagnosis Date  . Asthma   . GERD (gastroesophageal reflux disease)   . Hypertension    Past Surgical History:  Procedure Laterality Date  . NO PAST SURGERIES    . TUBAL LIGATION     Social History   Socioeconomic History  . Marital status: Single    Spouse name: Not on file  . Number of children: 3  . Years of education: Not on file  . Highest education level: Not on file  Occupational History  . Not on file  Social Needs  . Financial resource strain: Not on file  . Food insecurity    Worry: Not on file    Inability: Not on file  . Transportation needs    Medical: Not on file    Non-medical: Not on file  Tobacco Use  . Smoking status: Never Smoker  . Smokeless tobacco: Never Used  Substance and Sexual Activity  . Alcohol use: Yes    Comment: occassional  . Drug use: No  . Sexual activity: Not on file  Lifestyle  . Physical activity    Days per week: Not on file    Minutes per session: Not on file  . Stress: Not on file  Relationships  . Social Herbalist on phone: Not on file    Gets together: Not on file    Attends religious service: Not on  file    Active member of club or organization: Not on file    Attends meetings of clubs or organizations: Not on file    Relationship status: Not on file  . Intimate partner violence    Fear of current or ex partner: Not on file    Emotionally abused: Not on file    Physically abused: Not on file    Forced sexual activity: Not on file  Other Topics Concern  . Not on file  Social History Narrative  . Not on file   ROS  Review of Systems  Constitution: Negative for chills, decreased appetite, malaise/fatigue and weight gain.  Cardiovascular: Positive for chest pain and palpitations. Negative for dyspnea on exertion, leg swelling and syncope.  Endocrine: Negative for cold intolerance.  Hematologic/Lymphatic: Does not bruise/bleed easily.  Musculoskeletal: Negative for joint swelling.  Gastrointestinal: Negative for abdominal pain, anorexia, change in bowel habit, hematochezia and melena.  Neurological: Negative for headaches and light-headedness.  Psychiatric/Behavioral: Negative for depression and substance abuse.  All other systems reviewed and are negative.  Objective   Vitals with BMI 10/25/2019 06/05/2019 01/30/2017  Height 5\' 7"  - 5\' 6"   Weight 219 lbs - 230 lbs  BMI 37.34 - 28.7  Systolic 681 157 -  Diastolic 81 90 -  Pulse 69 111 -    Blood pressure 131/81, pulse 69, temperature (!) 97.3 F (36.3 C), height 5\' 7"  (1.702 m), weight 219 lb (99.3 kg), SpO2 100 %. Body mass index is 34.3 kg/m.   Physical Exam  Constitutional:  She is moderately built no acute distress.  HENT:  Head: Atraumatic.  Eyes: Conjunctivae are normal.  Neck: Neck supple. No JVD present. No thyromegaly present.  Cardiovascular: Normal rate, regular rhythm, normal heart sounds and intact distal pulses. Exam reveals no gallop.  No murmur heard. No leg edema, no JVD.  Pulmonary/Chest: Effort normal and breath sounds normal.  Abdominal: Soft. Bowel sounds are normal.  Musculoskeletal: Normal range  of motion.  Neurological: She is alert.  Skin: Skin is warm and dry.  Psychiatric: She has a normal mood and affect.   Laboratory examination:   No results for input(s): NA, K, CL, CO2, GLUCOSE, BUN, CREATININE, CALCIUM, GFRNONAA, GFRAA in the last 8760 hours. CMP Latest Ref Rng & Units 05/27/2012  Glucose 70 - 99 mg/dL 295(M110(H)  BUN 6 - 23 mg/dL 10  Creatinine 8.410.50 - 3.241.10 mg/dL 4.010.81  Sodium 027135 - 253145 mEq/L 138  Potassium 3.5 - 5.1 mEq/L 3.1(L)  Chloride 96 - 112 mEq/L 104  CO2 19 - 32 mEq/L 21  Calcium 8.4 - 10.5 mg/dL 9.3   CBC Latest Ref Rng & Units 05/27/2012  WBC 4.0 - 10.5 K/uL 9.7  Hemoglobin 12.0 - 15.0 g/dL 66.413.1  Hematocrit 40.336.0 - 46.0 % 38.4  Platelets 150 - 400 K/uL 236   Lipid Panel  No results found for: CHOL, TRIG, HDL, CHOLHDL, VLDL, LDLCALC, LDLDIRECT HEMOGLOBIN A1C No results found for: HGBA1C, MPG TSH No results for input(s): TSH in the last 8760 hours. Medications and allergies  No Known Allergies   Prior to Admission medications   Medication Sig Start Date End Date Taking? Authorizing Provider  albuterol (VENTOLIN HFA) 108 (90 Base) MCG/ACT inhaler albuterol sulfate HFA 90 mcg/actuation aerosol inhaler  2 PUFFS AS NEEDED EVERY 4 HRS INHALATION 25   Yes [provider]  aspirin 81 MG chewable tablet Chew 81 mg by mouth daily.   Yes [provider]  Cetirizine HCl (ZYRTEC ALLERGY) 10 MG CAPS Take 1 capsule by mouth daily.   Yes [provider]  lisinopril-hydrochlorothiazide (PRINZIDE,ZESTORETIC) 20-12.5 MG per tablet Take 1 tablet by mouth daily.   Yes [provider]  omeprazole (PRILOSEC) 20 MG capsule Take 20 mg by mouth daily.   Yes [provider]  valACYclovir (VALTREX) 500 MG tablet Take 500 mg by mouth daily.   Yes [provider]     Current Outpatient Medications  Medication Instructions  . albuterol (VENTOLIN HFA) 108 (90 Base) MCG/ACT inhaler albuterol sulfate HFA 90 mcg/actuation aerosol  inhaler  2 PUFFS AS NEEDED EVERY 4 HRS INHALATION 25  . aspirin 81 mg, Oral, Daily  . Cetirizine HCl (ZYRTEC ALLERGY) 10 MG CAPS 1 capsule, Oral, Daily  . lisinopril-hydrochlorothiazide (PRINZIDE,ZESTORETIC) 20-12.5 MG per tablet 1 tablet, Daily  . omeprazole (PRILOSEC) 20 mg, Daily  . valACYclovir (VALTREX) 500 mg, Daily    Radiology:  No results found.   Cardiac Studies:   None  Assessment     ICD-10-CM   1. Palpitations  R00.2 EKG 12-Lead    PCV CARDIAC STRESS TEST  2. Essential hypertension  I10 PCV CARDIAC STRESS TEST    PCV ECHOCARDIOGRAM COMPLETE  3. Atypical chest pain  R07.89 PCV CARDIAC STRESS  TEST    PCV ECHOCARDIOGRAM COMPLETE    EKG 10/25/2019: Normal sinus rhythm with rate of 74 bpm, normal axis.  No evidence of ischemia, normal EKG.   Recommendations:   Patient seen for new onset of palpitation that started about 2 weeks ago and also during the episodes of palpitations had some chest discomfort in the form of tightness, episode lasting few seconds and not necessarily exertional.  Episode lasted for about a week on and off lasting a few seconds but has not had any further episodes in the past 1 week.  Physical examination except obesity is normal, with a normal EKG.  She is extremely concerned as she had COVID-19 in September 2020, I will obtain an echocardiogram and also a routine treadmill exercise stress test.  We will also obtain labs from her PCP.  I reassured her, advised her to increase her activity, she has stopped exercising we discussed regarding obesity and exercise and primary prevention.  She also appears to be extremely anxious.  We will see her back after the test.  I do not think she needs event monitoring at this point as she is essentially asymptomatic with regard to palpitations. Presently her blood pressure is under did not make any changes to her medications.  Well controlled n  Yates Decamp, MD, [facility] 10/25/2019, 10:33 AM [facility].  [place] Pager: [phone] Office: [phone] If no answer Cell [phone]

## 2019-11-08 ENCOUNTER — Other Ambulatory Visit: Payer: Self-pay

## 2019-11-08 ENCOUNTER — Ambulatory Visit
Admission: RE | Admit: 2019-11-08 | Discharge: 2019-11-08 | Disposition: A | Discharge status: Home / Self Care | Payer: BC Managed Care – PPO | Source: Ambulatory Visit | Attending: Obstetrics and Gynecology | Admitting: Obstetrics and Gynecology

## 2019-11-08 DIAGNOSIS — Z1231 Encounter for screening mammogram for malignant neoplasm of breast: Secondary | ICD-10-CM

## 2019-11-08 IMAGING — MG DIGITAL SCREENING BILAT W/ TOMO W/ CAD
8 series · 8 of 24 positions shown · non-contrast
Comparison: Previous exam(s).

CLINICAL DATA: Screening.

EXAM:
DIGITAL SCREENING BILATERAL MAMMOGRAM WITH TOMO AND CAD

[L CC synth-2D]
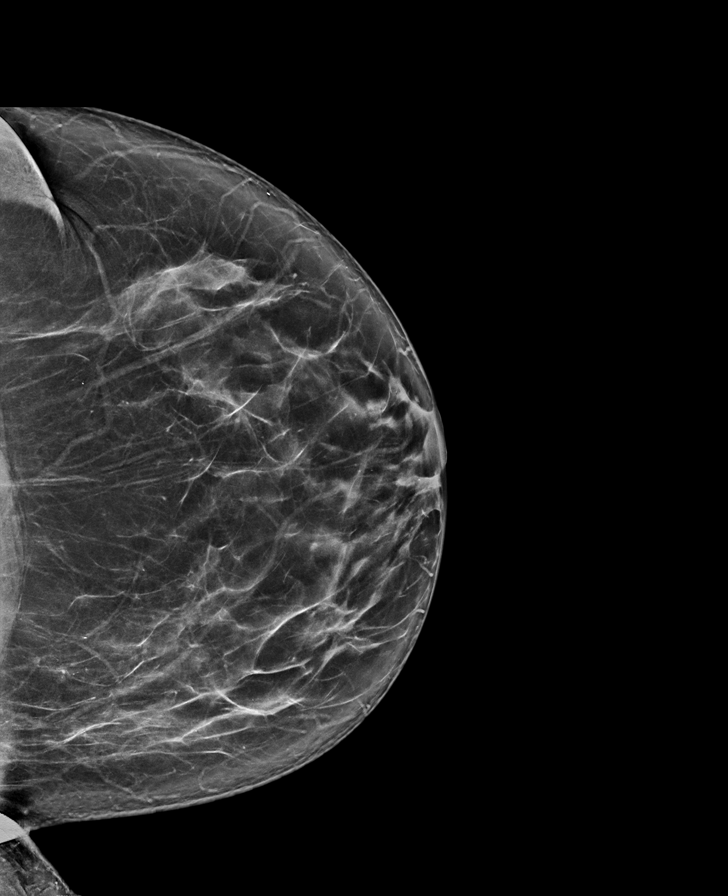

[R CC synth-2D]
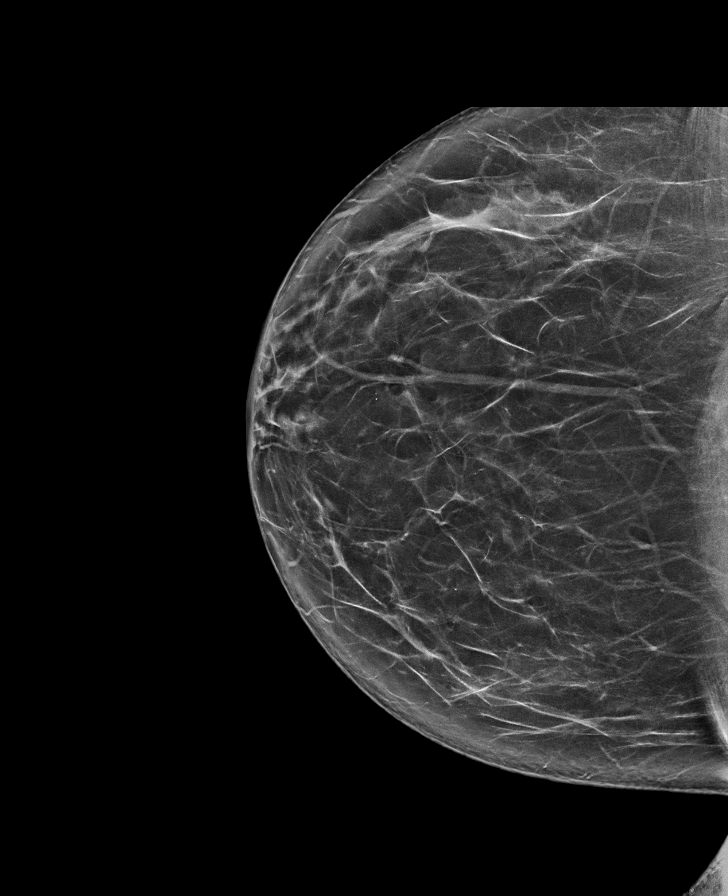

[L MLO synth-2D]
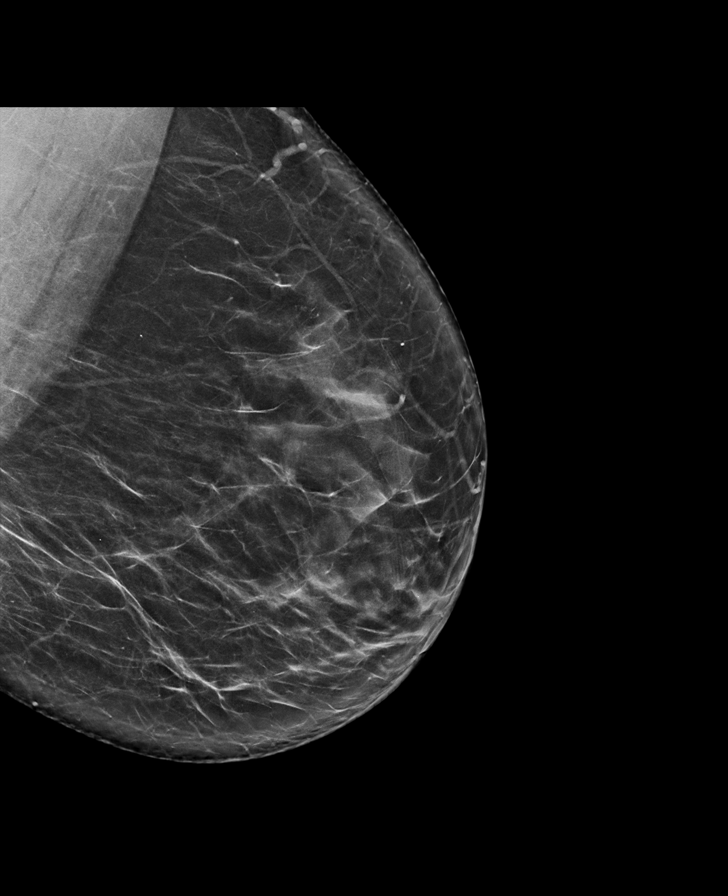

[R MLO synth-2D]
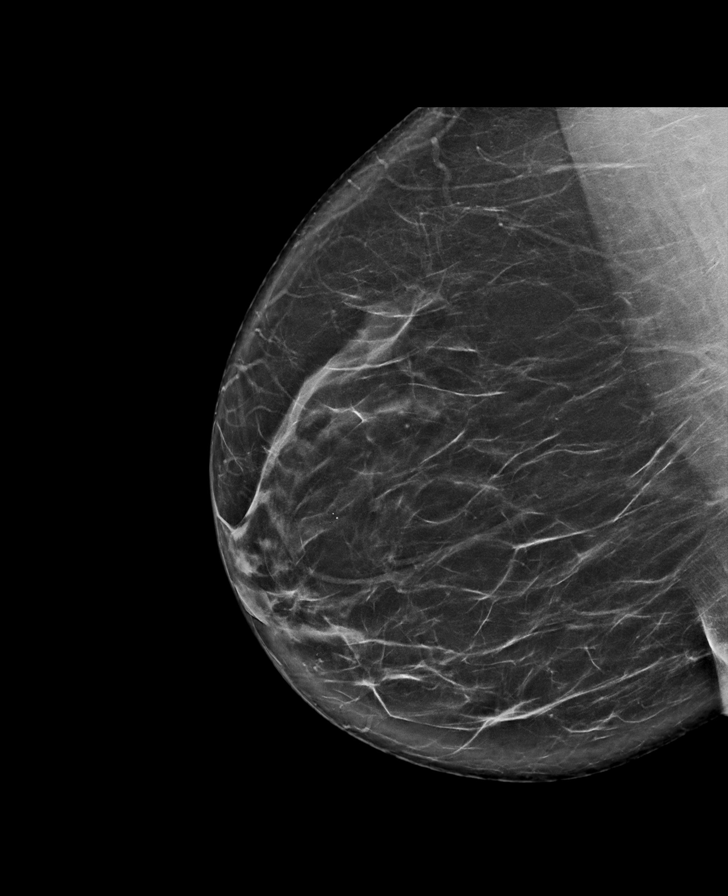

[L MLO tomo · tomo slice 47/93.0]
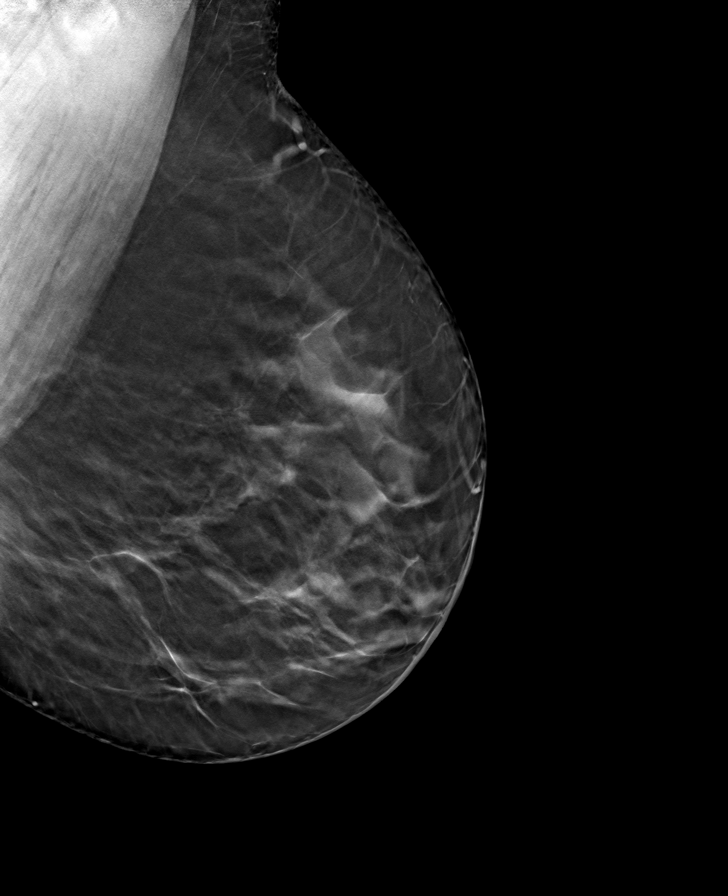

[R MLO tomo · tomo slice 49/96.0]
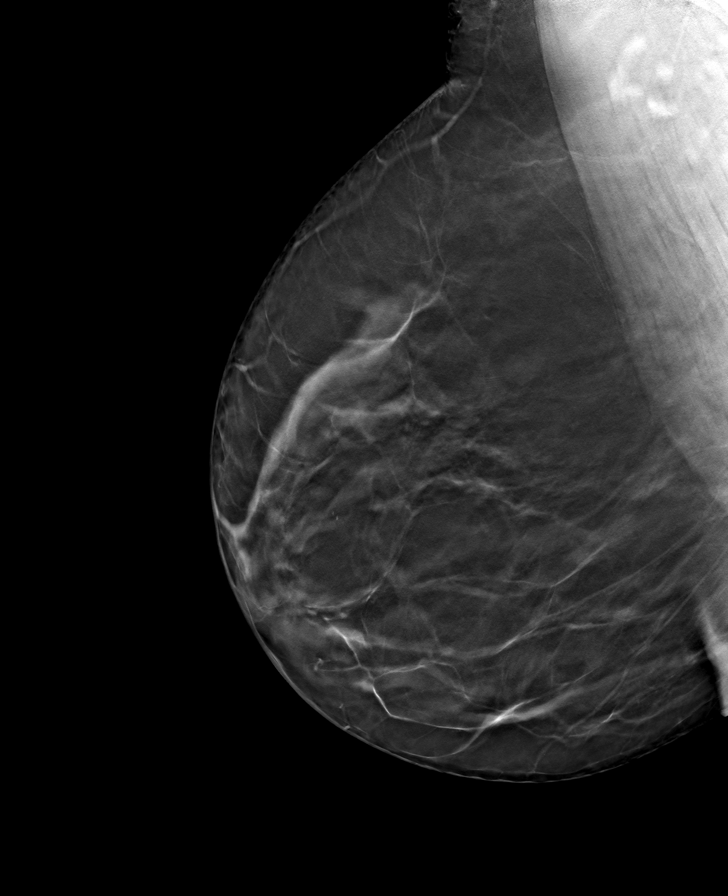

[R CC tomo · tomo slice 44/87.0]
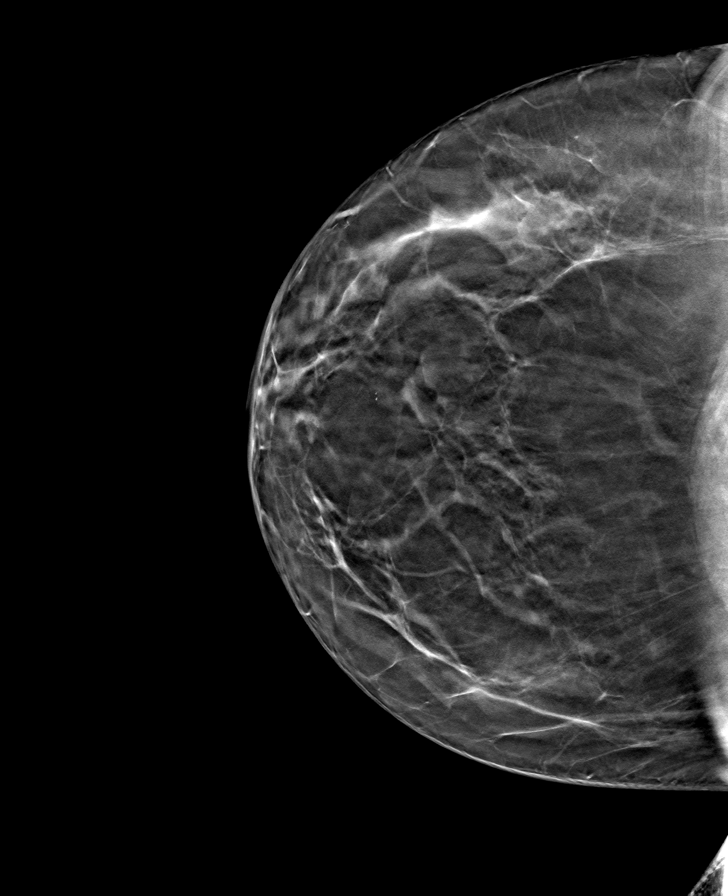

[L CC tomo · tomo slice 43/85.0]
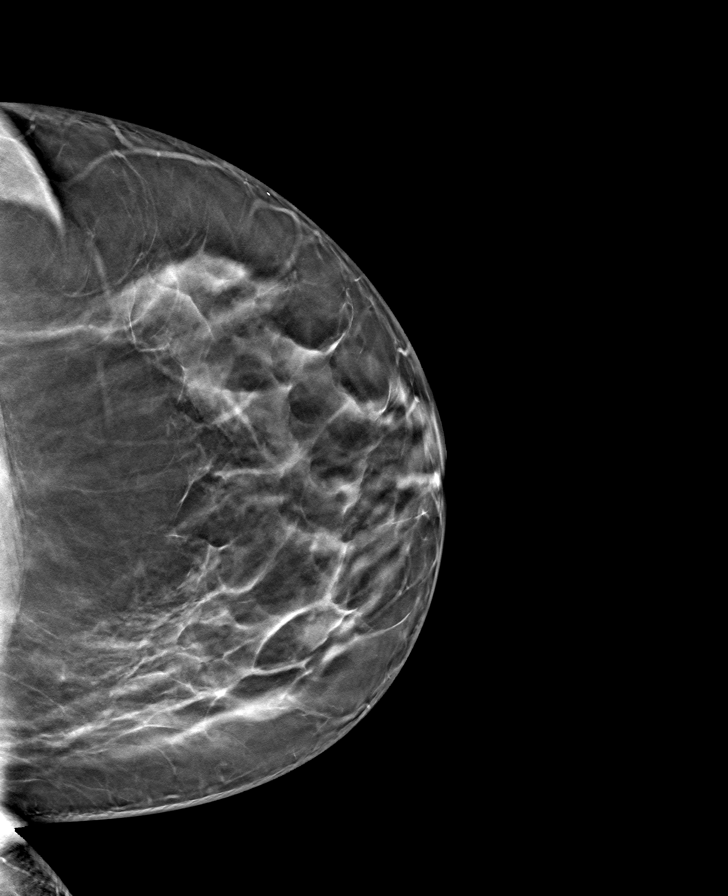

[8 of 24 positions shown; findings below may reference images not displayed]

ACR Breast Density Category b: There are scattered areas of
fibroglandular density.
FINDINGS: There are no findings suspicious for malignancy. Images were
processed with CAD.
IMPRESSION: No mammographic evidence of malignancy. A result letter of this
screening mammogram will be mailed directly to the patient.

RECOMMENDATION:
Screening mammogram in one year. (Code:[TQ])

BI-RADS CATEGORY  1: Negative.

## 2019-12-09 ENCOUNTER — Ambulatory Visit: Payer: BC Managed Care – PPO | Admitting: Cardiology

## 2019-12-11 ENCOUNTER — Other Ambulatory Visit: Payer: Self-pay

## 2019-12-11 ENCOUNTER — Ambulatory Visit (INDEPENDENT_AMBULATORY_CARE_PROVIDER_SITE_OTHER): Payer: BC Managed Care – PPO

## 2019-12-11 ENCOUNTER — Other Ambulatory Visit: Payer: BC Managed Care – PPO

## 2019-12-11 DIAGNOSIS — R0789 Other chest pain: Secondary | ICD-10-CM | POA: Diagnosis not present

## 2019-12-11 DIAGNOSIS — I10 Essential (primary) hypertension: Secondary | ICD-10-CM | POA: Diagnosis not present

## 2019-12-14 NOTE — Progress Notes (Signed)
Normal LV function. Moderte MR.  MR= Mitral regurgitation. (leakage on the left heart valve)  Similarly TR means tricuspid regurgitation (leakage on the right heart valve). Mild MR or TR are probably normal variants unless physicians feel it is abnormal. Reassure.

## 2019-12-18 NOTE — Progress Notes (Signed)
Lvm with details

## 2020-01-22 ENCOUNTER — Ambulatory Visit: Payer: BC Managed Care – PPO | Admitting: Cardiology

## 2020-02-17 ENCOUNTER — Other Ambulatory Visit: Payer: Self-pay

## 2020-02-17 ENCOUNTER — Ambulatory Visit: Payer: BC Managed Care – PPO | Admitting: Cardiology

## 2020-02-17 ENCOUNTER — Ambulatory Visit: Payer: BC Managed Care – PPO

## 2020-02-17 ENCOUNTER — Encounter: Payer: Self-pay | Admitting: Cardiology

## 2020-02-17 VITALS — BP 116/76 | HR 90 | Ht 67.0 in | Wt 223.4 lb

## 2020-02-17 DIAGNOSIS — I340 Nonrheumatic mitral (valve) insufficiency: Secondary | ICD-10-CM

## 2020-02-17 DIAGNOSIS — R002 Palpitations: Secondary | ICD-10-CM

## 2020-02-17 DIAGNOSIS — I10 Essential (primary) hypertension: Secondary | ICD-10-CM

## 2020-02-17 NOTE — Progress Notes (Signed)
Primary Physician/Referring:  Merri Brunette, MD  Patient ID: Christy Schultz, female    DOB: [birth year], 54 y.o.   MRN: [id number]  Chief Complaint  Patient presents with  . Chest Pain  . Palpitations  . Results    echo  . Follow-up    6wk   HPI:    Christy Schultz  is a 54 y.o. African-American female with hypertension, mild obesity, who had COVID-19 in September 2020, started having palpitations and chest tightness sometime after this, echocardiogram and treadmill stress testing was recommended. Treadmill stress testing has not been performed in view of pandemic, she did undergo echocardiogram and now presents for follow up.   Since she was last seen in October, She continues to have episodes of palpitations during times of stress. Describes as heart racing. Can last 10-15 minutes. Tries to calm herself down. No dyspnea or recent episodes of chest pain.   Hypertension is well controlled. Denies diabetes or hyperlipidemia, she is a non-smoker.  She has not been exercising regularly recently due to the pandemic. Under a lot of stress with her job. She has had some worsening acid reflux.   Past Medical History:  Diagnosis Date  . Asthma   . GERD (gastroesophageal reflux disease)   . Hypertension    Past Surgical History:  Procedure Laterality Date  . NO PAST SURGERIES    . TUBAL LIGATION     Social History   Socioeconomic History  . Marital status: Single    Spouse name: Not on file  . Number of children: 3  . Years of education: Not on file  . Highest education level: Not on file  Occupational History  . Not on file  Tobacco Use  . Smoking status: Never Smoker  . Smokeless tobacco: Never Used  Substance and Sexual Activity  . Alcohol use: Yes    Comment: occassional  . Drug use: No  . Sexual activity: Not on file  Other Topics Concern  . Not on file  Social History Narrative  . Not on file   Social Determinants of Health   Financial Resource Strain:   .  Difficulty of Paying Living Expenses: Not on file  Food Insecurity:   . Worried About Programme researcher, broadcasting/film/video in the Last Year: Not on file  . Ran Out of Food in the Last Year: Not on file  Transportation Needs:   . Lack of Transportation (Medical): Not on file  . Lack of Transportation (Non-Medical): Not on file  Physical Activity:   . Days of Exercise per Week: Not on file  . Minutes of Exercise per Session: Not on file  Stress:   . Feeling of Stress : Not on file  Social Connections:   . Frequency of Communication with Friends and Family: Not on file  . Frequency of Social Gatherings with Friends and Family: Not on file  . Attends Religious Services: Not on file  . Active Member of Clubs or Organizations: Not on file  . Attends Banker Meetings: Not on file  . Marital Status: Not on file  Intimate Partner Violence:   . Fear of Current or Ex-Partner: Not on file  . Emotionally Abused: Not on file  . Physically Abused: Not on file  . Sexually Abused: Not on file   ROS  Review of Systems  Constitution: Negative for chills, decreased appetite, malaise/fatigue and weight gain.  Cardiovascular: Positive for palpitations. Negative for chest pain, dyspnea on exertion, leg swelling  and syncope.  Endocrine: Negative for cold intolerance.  Hematologic/Lymphatic: Does not bruise/bleed easily.  Musculoskeletal: Negative for joint swelling.  Gastrointestinal: Negative for abdominal pain, anorexia, change in bowel habit, hematochezia and melena.  Neurological: Negative for headaches and light-headedness.  Psychiatric/Behavioral: Negative for depression and substance abuse.  All other systems reviewed and are negative.  Objective   Vitals with BMI 02/17/2020 10/25/2019 06/05/2019  Height 5\' 7"  5\' 7"  -  Weight 223 lbs 6 oz 219 lbs -  BMI 70.26 37.85 -  Systolic 885 027 741  Diastolic 76 81 90  Pulse 90 69 111    Blood pressure 116/76, pulse 90, height 5\' 7"  (1.702 m), weight  223 lb 6.4 oz (101.3 kg), SpO2 99 %. Body mass index is 34.99 kg/m.   Physical Exam  Constitutional:  She is moderately built no acute distress.  HENT:  Head: Atraumatic.  Eyes: Conjunctivae are normal.  Neck: No JVD present. No thyromegaly present.  Cardiovascular: Normal rate, regular rhythm, normal heart sounds and intact distal pulses. Exam reveals no gallop.  No murmur heard. No leg edema, no JVD.  Pulmonary/Chest: Effort normal and breath sounds normal.  Abdominal: Soft. Bowel sounds are normal.  Musculoskeletal:        General: Normal range of motion.     Cervical back: Neck supple.  Neurological: She is alert.  Skin: Skin is warm and dry.  Psychiatric: She has a normal mood and affect.   Laboratory examination:   No results for input(s): NA, K, CL, CO2, GLUCOSE, BUN, CREATININE, CALCIUM, GFRNONAA, GFRAA in the last 8760 hours. CMP Latest Ref Rng & Units 05/27/2012  Glucose 70 - 99 mg/dL 110(H)  BUN 6 - 23 mg/dL 10  Creatinine 0.50 - 1.10 mg/dL 0.81  Sodium 135 - 145 mEq/L 138  Potassium 3.5 - 5.1 mEq/L 3.1(L)  Chloride 96 - 112 mEq/L 104  CO2 19 - 32 mEq/L 21  Calcium 8.4 - 10.5 mg/dL 9.3   CBC Latest Ref Rng & Units 05/27/2012  WBC 4.0 - 10.5 K/uL 9.7  Hemoglobin 12.0 - 15.0 g/dL 13.1  Hematocrit 36.0 - 46.0 % 38.4  Platelets 150 - 400 K/uL 236   Lipid Panel  No results found for: CHOL, TRIG, HDL, CHOLHDL, VLDL, LDLCALC, LDLDIRECT HEMOGLOBIN A1C No results found for: HGBA1C, MPG TSH No results for input(s): TSH in the last 8760 hours. Medications and allergies  No Known Allergies   Prior to Admission medications   Medication Sig Start Date End Date Taking? Authorizing Provider  albuterol (VENTOLIN HFA) 108 (90 Base) MCG/ACT inhaler albuterol sulfate HFA 90 mcg/actuation aerosol inhaler  2 PUFFS AS NEEDED EVERY 4 HRS INHALATION 25   Yes [provider]  aspirin 81 MG chewable tablet Chew 81 mg by mouth daily.   Yes [provider]    Cetirizine HCl (ZYRTEC ALLERGY) 10 MG CAPS Take 1 capsule by mouth daily.   Yes [provider]  lisinopril-hydrochlorothiazide (PRINZIDE,ZESTORETIC) 20-12.5 MG per tablet Take 1 tablet by mouth daily.   Yes [provider]  omeprazole (PRILOSEC) 20 MG capsule Take 20 mg by mouth daily.   Yes [provider]  valACYclovir (VALTREX) 500 MG tablet Take 500 mg by mouth daily.   Yes [provider]     Current Outpatient Medications  Medication Instructions  . albuterol (VENTOLIN HFA) 108 (90 Base) MCG/ACT inhaler albuterol sulfate HFA 90 mcg/actuation aerosol inhaler  2 PUFFS AS NEEDED EVERY 4 HRS INHALATION 25  . aspirin  81 mg, Oral, Daily  . Cetirizine HCl (ZYRTEC ALLERGY) 10 MG CAPS 1 capsule, Oral, As needed  . Ciprofloxacin HCl 0.2 % otic solution As needed  . lisinopril-hydrochlorothiazide (PRINZIDE,ZESTORETIC) 20-12.5 MG per tablet 1 tablet, Daily  . omeprazole (PRILOSEC) 20 mg, Daily  . valACYclovir (VALTREX) 500 mg, Daily    Radiology:  No results found.   Cardiac Studies:   Echocardiogram 12/11/2019:  Left ventricle cavity is normal in size and thickness. Normal global wall  motion. Normal LV systolic function with visual EF 55-60%. Normal  diastolic filling pattern.  Moderate (Grade II), eccentric, anteriorly directed mitral regurgitation.  Mild tricuspid regurgitation. Estimated pulmonary artery systolic pressure  is 25 mmHg.  Assessment     ICD-10-CM   1. Palpitations  R00.2 Cardiac event monitor  2. Moderate mitral regurgitation  I34.0   3. Essential hypertension  I10     EKG 10/25/2019: Normal sinus rhythm with rate of 74 bpm, normal axis.  No evidence of ischemia, normal EKG.   Recommendations:   I have reviewed and discussed her recent echocardiogram results, normal LVEF.  Normal wall motion.  She does have moderate mitral regurgitation which was fully explained to the patient.  I would recommend repeating echocardiogram  in approximately 1 year for continued surveillance.  No clinical evidence of heart failure.  She has continued to have episodes of palpitations that are suggestive of PVCs; however, cannot fully exclude SVT.  I recommended 7-day event monitor for further evaluation.  She is under significant stress and is also having worsening GERD which could be contributing to her PVCs if this is what she is truly found to have.  I have not made any medication changes at this time, will make further recommendations after event monitoring is performed.  She has not had any further episodes of chest pain since last seen by [place].  Given her normal echocardiogram and resolution of chest pain, do not feel that she need stress testing at this point.  We will continue to monitor.  I will plan to see back in 3 weeks for virtual visit to discuss her event monitor results make further recommendations at that time.

## 2020-03-02 ENCOUNTER — Other Ambulatory Visit: Payer: Self-pay

## 2020-03-02 ENCOUNTER — Encounter: Payer: Self-pay | Admitting: Cardiology

## 2020-03-02 ENCOUNTER — Telehealth: Payer: BC Managed Care – PPO | Admitting: Cardiology

## 2020-03-02 VITALS — Ht 66.0 in | Wt 220.0 lb

## 2020-03-02 DIAGNOSIS — I10 Essential (primary) hypertension: Secondary | ICD-10-CM

## 2020-03-02 DIAGNOSIS — R002 Palpitations: Secondary | ICD-10-CM

## 2020-03-02 DIAGNOSIS — I340 Nonrheumatic mitral (valve) insufficiency: Secondary | ICD-10-CM

## 2020-03-02 NOTE — Progress Notes (Signed)
Primary Physician/Referring:  Deland Pretty, MD  Patient ID: Christy Schultz, female    DOB: 07/23/1966, 54 y.o.   MRN: [id number]  Chief Complaint  Patient presents with  . Palpitations  . Follow-up    2 weeks   This visit type was conducted due to national recommendations for restrictions regarding the COVID-19 Pandemic (e.g. social distancing).  This format is felt to be most appropriate for this patient at this time.  All issues noted in this document were discussed and addressed.  No physical exam was performed (except for noted visual exam findings with Telehealth visits).  The patient has consented to conduct a Telehealth visit and understands insurance will be billed.   I discussed the limitations of evaluation and management by telemedicine and the availability of in person appointments. The patient expressed understanding and agreed to proceed.  Virtual Visit via Video Note is as below  I connected with@, on 03/02/20 at 1400 by telephone and verified that I am speaking with the correct person using two identifiers. Unable to perform video visit as patient did not have equipment.    I have discussed with the patient regarding the safety during COVID Pandemic and steps and precautions including social distancing with the patient.    HPI:    Christy Schultz  is a 54 y.o. African-American female with hypertension, mild obesity, who had COVID-19 in September 2020, started having palpitations and chest tightness sometime after this, echocardiogram and treadmill stress testing was recommended. Treadmill stress testing has not been performed in view of pandemic, she did undergo echocardiogram in Dec 2020 that showed normal LVEF with moderate mitral regurgitation. At her last office visit 3 weeks ago, she complained of palpitations suggestive of possible SVT. She was placed on 7 day event monitor and now presents for follow up.   She is doing well since last seen by me. She reports that she  has not had any recurrence of palpitations. No further heart racing. Did not have a way to check her blood pressure today.   Hypertension is well controlled. Denies diabetes or hyperlipidemia, she is a non-smoker.  She has not been exercising regularly recently due to the pandemic. Under a lot of stress with her job. She has had some worsening acid reflux.   Past Medical History:  Diagnosis Date  . Asthma   . GERD (gastroesophageal reflux disease)   . Hypertension    Past Surgical History:  Procedure Laterality Date  . NO PAST SURGERIES    . TUBAL LIGATION     Social History   Socioeconomic History  . Marital status: Single    Spouse name: Not on file  . Number of children: 3  . Years of education: Not on file  . Highest education level: Not on file  Occupational History  . Not on file  Tobacco Use  . Smoking status: Never Smoker  . Smokeless tobacco: Never Used  Substance and Sexual Activity  . Alcohol use: Yes    Comment: occassional  . Drug use: No  . Sexual activity: Not on file  Other Topics Concern  . Not on file  Social History Narrative  . Not on file   Social Determinants of Health   Financial Resource Strain:   . Difficulty of Paying Living Expenses: Not on file  Food Insecurity:   . Worried About Charity fundraiser in the Last Year: Not on file  . Ran Out of Food in the Last Year:  Not on file  Transportation Needs:   . Lack of Transportation (Medical): Not on file  . Lack of Transportation (Non-Medical): Not on file  Physical Activity:   . Days of Exercise per Week: Not on file  . Minutes of Exercise per Session: Not on file  Stress:   . Feeling of Stress : Not on file  Social Connections:   . Frequency of Communication with Friends and Family: Not on file  . Frequency of Social Gatherings with Friends and Family: Not on file  . Attends Religious Services: Not on file  . Active Member of Clubs or Organizations: Not on file  . Attends Tax inspector Meetings: Not on file  . Marital Status: Not on file  Intimate Partner Violence:   . Fear of Current or Ex-Partner: Not on file  . Emotionally Abused: Not on file  . Physically Abused: Not on file  . Sexually Abused: Not on file   ROS  Review of Systems  Constitution: Negative for chills, decreased appetite, malaise/fatigue and weight gain.  Cardiovascular: Negative for chest pain, dyspnea on exertion, leg swelling, palpitations and syncope.  Endocrine: Negative for cold intolerance.  Hematologic/Lymphatic: Does not bruise/bleed easily.  Musculoskeletal: Negative for joint swelling.  Gastrointestinal: Negative for abdominal pain, anorexia, change in bowel habit, hematochezia and melena.  Neurological: Negative for headaches and light-headedness.  Psychiatric/Behavioral: Negative for depression and substance abuse.  All other systems reviewed and are negative.  Objective   Vitals with BMI 03/02/2020 02/17/2020 10/25/2019  Height 5\' 6"  5\' 7"  5\' 7"   Weight 220 lbs 223 lbs 6 oz 219 lbs  BMI 35.53 34.98 34.29  Systolic (No Data) 116 131  Diastolic (No Data) 76 81  Pulse - 90 69    Height 5\' 6"  (1.676 m), weight 220 lb (99.8 kg). Body mass index is 35.51 kg/m.   Physical exam not performed or limited due to virtual visit.    Please see exam details from prior visit is as below.   Physical Exam  Constitutional:  She is moderately built no acute distress.  HENT:  Head: Atraumatic.  Eyes: Conjunctivae are normal.  Neck: No JVD present. No thyromegaly present.  Cardiovascular: Normal rate, regular rhythm, normal heart sounds and intact distal pulses. Exam reveals no gallop.  No murmur heard. No leg edema, no JVD.  Pulmonary/Chest: Effort normal and breath sounds normal.  Abdominal: Soft. Bowel sounds are normal.  Musculoskeletal:        General: Normal range of motion.     Cervical back: Neck supple.  Neurological: She is alert.  Skin: Skin is warm and dry.    Psychiatric: She has a normal mood and affect.   Laboratory examination:   No results for input(s): NA, K, CL, CO2, GLUCOSE, BUN, CREATININE, CALCIUM, GFRNONAA, GFRAA in the last 8760 hours. CMP Latest Ref Rng & Units 05/27/2012  Glucose 70 - 99 mg/dL )  BUN 6 - 23 mg/dL 10  Creatinine - mg/dL 07/27/2012  Sodium 254(Y - 7.06 mEq/L 138  Potassium 3.5 - 5.1 mEq/L 3.1(L)  Chloride 96 - 112 mEq/L 104  CO2 19 - 32 mEq/L 21  Calcium 8.4 - 10.5 mg/dL 9.3   CBC Latest Ref Rng & Units 05/27/2012  WBC 4.0 - 10.5 K/uL 9.7  Hemoglobin 12.0 - 15.0 g/dL 6.28  Hematocrit 315 - 46.0 % 38.4  Platelets 150 - 400 K/uL 236   Lipid Panel  No results found for: CHOL, TRIG, HDL, CHOLHDL,  VLDL, LDLCALC, LDLDIRECT HEMOGLOBIN A1C No results found for: HGBA1C, MPG TSH No results for input(s): TSH in the last 8760 hours. Medications and allergies  No Known Allergies   Prior to Admission medications   Medication Sig Start Date End Date Taking? Authorizing Provider  albuterol (VENTOLIN HFA) 108 (90 Base) MCG/ACT inhaler albuterol sulfate HFA 90 mcg/actuation aerosol inhaler  2 PUFFS AS NEEDED EVERY 4 HRS INHALATION 25   Yes [provider]  aspirin 81 MG chewable tablet Chew 81 mg by mouth daily.   Yes [provider]  Cetirizine HCl (ZYRTEC ALLERGY) 10 MG CAPS Take 1 capsule by mouth daily.   Yes [provider]  lisinopril-hydrochlorothiazide (PRINZIDE,ZESTORETIC) 20-12.5 MG per tablet Take 1 tablet by mouth daily.   Yes [provider]  omeprazole (PRILOSEC) 20 MG capsule Take 20 mg by mouth daily.   Yes [provider]  valACYclovir (VALTREX) 500 MG tablet Take 500 mg by mouth daily.   Yes [provider]     Current Outpatient Medications  Medication Instructions  . albuterol (VENTOLIN HFA) 108 (90 Base) MCG/ACT inhaler albuterol sulfate HFA 90 mcg/actuation aerosol inhaler  2 PUFFS AS NEEDED EVERY 4 HRS INHALATION 25  . aspirin 81 mg,  Oral, Daily  . Cetirizine HCl (ZYRTEC ALLERGY) 10 MG CAPS 1 capsule, Oral, As needed  . Ciprofloxacin HCl 0.2 % otic solution As needed  . lisinopril-hydrochlorothiazide (PRINZIDE,ZESTORETIC) 20-12.5 MG per tablet 1 tablet, Daily  . omeprazole (PRILOSEC) 20 mg, Daily  . valACYclovir (VALTREX) 500 mg, Daily    Radiology:  No results found.   Cardiac Studies:   7 day event monitor 02/22-02/28/2021: Normal sinus rhythm. 5 patient triggered event without reported symptoms correlated with sinus rhythm. No A fib or SVT was noted.   Echocardiogram 12/11/2019:  Left ventricle cavity is normal in size and thickness. Normal global wall  motion. Normal LV systolic function with visual EF 55-60%. Normal  diastolic filling pattern.  Moderate (Grade II), eccentric, anteriorly directed mitral regurgitation.  Mild tricuspid regurgitation. Estimated pulmonary artery systolic pressure  is 25 mmHg.  Assessment     ICD-10-CM   1. Palpitations  R00.2   2. Moderate mitral regurgitation  I34.0   3. Essential hypertension  I10     EKG 10/25/2019: Normal sinus rhythm with rate of 74 bpm, normal axis.  No evidence of ischemia, normal EKG.   Recommendations:   I have reviewed and discussed her recent event monitor results, no arrhythmias were noted. She has not had any recurrence of symptoms since last seen by me. While her symptoms were fairly suggestive of PVC's, she did have episodes of heart racing, potentially suggestive of SVT. Will continue with watchful waiting for now, encouraged her to contact me should she have any recurrence of symptoms. She has not had any further chest pain.   Blood pressure has generally been well controlled, although she did not have a way to check today as she was at work. Continue with present medications. She does have moderate MR on her echo in Dec 2020, will need continue surveillance. I will plan to see her back in 3 months for follow up on her palpitations. If she  has remained stable at that time, will consider PRN follow up.   Toniann Fail, MSN, APRN, FNP-C [facility] Office: [phone] Fax: [phone]

## 2020-06-25 ENCOUNTER — Ambulatory Visit: Payer: BC Managed Care – PPO | Admitting: Cardiology

## 2020-10-01 ENCOUNTER — Other Ambulatory Visit: Payer: Self-pay | Admitting: Obstetrics and Gynecology

## 2020-10-01 DIAGNOSIS — Z1231 Encounter for screening mammogram for malignant neoplasm of breast: Secondary | ICD-10-CM

## 2020-11-07 ENCOUNTER — Other Ambulatory Visit: Payer: Self-pay

## 2020-11-07 ENCOUNTER — Encounter (HOSPITAL_COMMUNITY): Payer: Self-pay

## 2020-11-07 ENCOUNTER — Ambulatory Visit (HOSPITAL_COMMUNITY)
Admission: EM | Admit: 2020-11-07 | Discharge: 2020-11-07 | Disposition: A | Discharge status: Home / Self Care | Payer: BC Managed Care – PPO | Attending: Emergency Medicine | Admitting: Emergency Medicine

## 2020-11-07 DIAGNOSIS — M545 Low back pain, unspecified: Secondary | ICD-10-CM | POA: Diagnosis not present

## 2020-11-07 MED ORDER — IBUPROFEN 800 MG PO TABS: 800.0000 mg | ORAL_TABLET | Freq: Three times a day (TID) | ORAL | 0 refills | Status: DC | PRN

## 2020-11-07 MED ORDER — CYCLOBENZAPRINE HCL 10 MG PO TABS: 10.0000 mg | ORAL_TABLET | Freq: Two times a day (BID) | ORAL | 0 refills | Status: DC | PRN

## 2020-11-07 NOTE — ED Provider Notes (Signed)
[facility]    CSN: [id number] Arrival date & time: 11/07/20  1716      History   Chief Complaint Chief Complaint  Patient presents with  . Back Pain    HPI Christy Schultz is a 54 y.o. female.  Patient presents with bilateral low back pain x several months, which is worse x1 week. She reports some generalized weakness in both her legs; no numbness or tingling. 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 Treatment attempted at home with Tylenol and ibuprofen. She denies fever, chills, abdominal pain, dysuria, vaginal symptoms, or other symptoms. Her medical history includes hypertension, asthma, GERD.  The history is provided by the patient and medical records.    Past Medical History:  Diagnosis Date  . Asthma   . GERD (gastroesophageal reflux disease)   . Hypertension     Patient Active Problem List   Diagnosis Date Noted  . Right hand pain 01/30/2017  . Hypertension   . GERD (gastroesophageal reflux disease)     Past Surgical History:  Procedure Laterality Date  . NO PAST SURGERIES    . TUBAL LIGATION      OB History   No obstetric history on file.      Home Medications    Prior to Admission medications   Medication Sig Start Date End Date Taking? Authorizing Provider  albuterol (VENTOLIN HFA) 108 (90 Base) MCG/ACT inhaler albuterol sulfate HFA 90 mcg/actuation aerosol inhaler  2 PUFFS AS NEEDED EVERY 4 HRS INHALATION 25    [provider]  aspirin 81 MG chewable tablet Chew 81 mg by mouth daily.    [provider]  Cetirizine HCl (ZYRTEC ALLERGY) 10 MG CAPS Take 1 capsule by mouth as needed.     [provider]  Ciprofloxacin HCl 0.2 % otic solution as needed.    [provider]  cyclobenzaprine (FLEXERIL) 10 MG tablet Take 1 tablet (10 mg total) by mouth 2 (two) times daily as needed for muscle spasms. 11/07/20   Mickie Bail, NP  ibuprofen (ADVIL) 800  MG tablet Take 1 tablet (800 mg total) by mouth every 8 (eight) hours as needed. 11/07/20   Mickie Bail, NP  lisinopril-hydrochlorothiazide (PRINZIDE,ZESTORETIC) 20-12.5 MG per tablet Take 1 tablet by mouth daily.    [provider]  omeprazole (PRILOSEC) 20 MG capsule Take 20 mg by mouth daily.    [provider]  valACYclovir (VALTREX) 500 MG tablet Take 500 mg by mouth daily.    [provider]    Family History Family History  Problem Relation Age of Onset  . Hypertension Mother   . Hyperlipidemia Mother   . Breast cancer Neg Hx     Social History Social History   Tobacco Use  . Smoking status: Never Smoker  . Smokeless tobacco: Never Used  Vaping Use  . Vaping Use: Never used  Substance Use Topics  . Alcohol use: Yes    Comment: occassional  . Drug use: No     Allergies   Patient has no known allergies.   Review of Systems Review of Systems  Constitutional: Negative for chills and fever.  HENT: Negative for ear pain and sore throat.   Eyes: Negative for pain and visual disturbance.  Respiratory: Negative for cough and shortness of breath.   Cardiovascular: Negative for chest pain and palpitations.  Gastrointestinal: Negative for abdominal pain and vomiting.  Genitourinary:  Negative for dysuria and hematuria.  Musculoskeletal: Positive for back pain. Negative for arthralgias.  Skin: Negative for color change and rash.  Neurological: Positive for weakness. Negative for seizures, syncope and numbness.  All other systems reviewed and are negative.    Physical Exam Triage Vital Signs ED Triage Vitals  Enc Vitals Group     BP      Pulse      Resp      Temp      Temp src      SpO2      Weight      Height      Head Circumference      Peak Flow      Pain Score      Pain Loc      Pain Edu?      Excl. in GC?    No data found.  Updated Vital Signs BP 125/88 (BP Location: Right Arm)   Pulse 91   Temp (!) 97.4 F (36.3 C)  (Oral)   Resp 18   SpO2 100%   Visual Acuity Right Eye Distance:   Left Eye Distance:   Bilateral Distance:    Right Eye Near:   Left Eye Near:    Bilateral Near:     Physical Exam Vitals and nursing note reviewed.  Constitutional:      General: She is not in acute distress.    Appearance: She is well-developed. She is obese. She is not ill-appearing.  HENT:     Head: Normocephalic and atraumatic.     Mouth/Throat:     Mouth: Mucous membranes are moist.  Eyes:     Conjunctiva/sclera: Conjunctivae normal.  Cardiovascular:     Rate and Rhythm: Normal rate and regular rhythm.     Heart sounds: No murmur heard.   Pulmonary:     Effort: Pulmonary effort is normal. No respiratory distress.     Breath sounds: Normal breath sounds.  Abdominal:     Palpations: Abdomen is soft.     Tenderness: There is no abdominal tenderness. There is no right CVA tenderness, left CVA tenderness, guarding or rebound.  Musculoskeletal:        General: No swelling, tenderness, deformity or signs of injury. Normal range of motion.     Cervical back: Neck supple.  Skin:    General: Skin is warm and dry.     Capillary Refill: Capillary refill takes less than 2 seconds.     Findings: No bruising, erythema, lesion or rash.  Neurological:     General: No focal deficit present.     Mental Status: She is alert and oriented to person, place, and time.     Sensory: No sensory deficit.     Motor: Motor function is intact. No weakness.     Coordination: Coordination normal.     Gait: Gait normal.     Comments: Negative straight leg raise.  Bilateral LE strength 5/5; sensation intact.   Psychiatric:        Mood and Affect: Mood normal.        Behavior: Behavior normal.      UC Treatments / Results  Labs (all labs ordered are listed, but only abnormal results are displayed) Labs Reviewed - No data to display  EKG   Radiology No results found.  Procedures Procedures (including critical care  time)  Medications Ordered in UC Medications - No data to display  Initial Impression / Assessment and Plan / UC Course  I have reviewed the triage vital signs and the nursing notes.  Pertinent labs & imaging results that were available during my care of the patient were reviewed by me and considered in my medical decision making (see chart for details).   Acute bilateral low back pain without sciatica. Treating with ibuprofen and Flexeril. Precautions for drowsiness with Flexeril discussed with patient. Instructed her to follow-up with orthopedics if her symptoms are not improving. Patient agrees to plan of care.     Final Clinical Impressions(s) / UC Diagnoses   Final diagnoses:  Acute bilateral low back pain without sciatica     Discharge Instructions     Take the prescribed ibuprofen as needed for your pain.  Take the muscle relaxer Flexeril as needed for muscle spasm; Do not drive, operate machinery, or drink alcohol with this medication as it may make you drowsy.    Follow up with an orthopedist such as the one listed below if your pain is not improving.        ED Prescriptions    Medication Sig Dispense Auth. Provider   ibuprofen (ADVIL) 800 MG tablet Take 1 tablet (800 mg total) by mouth every 8 (eight) hours as needed. 21 tablet Mickie Bail, NP   cyclobenzaprine (FLEXERIL) 10 MG tablet Take 1 tablet (10 mg total) by mouth 2 (two) times daily as needed for muscle spasms. 20 tablet Mickie Bail, NP     I have reviewed the PDMP during this encounter.   Mickie Bail, NP 11/07/20 [phone]

## 2020-11-07 NOTE — Discharge Instructions (Addendum)
Take the prescribed ibuprofen as needed for your pain.  Take the muscle relaxer Flexeril as needed for muscle spasm; Do not drive, operate machinery, or drink alcohol with this medication as it may make you drowsy.    Follow up with an orthopedist such as the one listed below if your pain is not improving.

## 2020-11-07 NOTE — ED Triage Notes (Signed)
Pt presents with chronic lower back pain that is progressing.

## 2020-11-11 ENCOUNTER — Ambulatory Visit
Admission: RE | Admit: 2020-11-11 | Discharge: 2020-11-11 | Disposition: A | Discharge status: Home / Self Care | Payer: BC Managed Care – PPO | Source: Ambulatory Visit | Attending: Obstetrics and Gynecology | Admitting: Obstetrics and Gynecology

## 2020-11-11 ENCOUNTER — Other Ambulatory Visit: Payer: Self-pay

## 2020-11-11 DIAGNOSIS — Z1231 Encounter for screening mammogram for malignant neoplasm of breast: Secondary | ICD-10-CM

## 2021-01-05 ENCOUNTER — Ambulatory Visit: Payer: BC Managed Care – PPO | Admitting: Family Medicine

## 2021-01-05 ENCOUNTER — Ambulatory Visit: Payer: Self-pay

## 2021-01-05 ENCOUNTER — Other Ambulatory Visit: Payer: Self-pay

## 2021-01-05 DIAGNOSIS — M545 Low back pain, unspecified: Secondary | ICD-10-CM | POA: Diagnosis not present

## 2021-01-05 DIAGNOSIS — G8929 Other chronic pain: Secondary | ICD-10-CM | POA: Diagnosis not present

## 2021-01-05 MED ORDER — MELOXICAM 15 MG PO TABS
7.5000 mg | ORAL_TABLET | Freq: Every day | ORAL | 6 refills | Status: DC | PRN
Start: 2021-01-05 — End: 2024-06-18

## 2021-01-05 MED ORDER — BACLOFEN 10 MG PO TABS
5.0000 mg | ORAL_TABLET | Freq: Three times a day (TID) | ORAL | 3 refills | Status: DC | PRN
Start: 2021-01-05 — End: 2024-04-25

## 2021-01-05 NOTE — Progress Notes (Signed)
   Office Visit Note   Patient: Christy Schultz           Date of Birth: April 14, 1966           MRN: [id number] Visit Date: 01/05/2021 Requested by: Merri Brunette, MD [address] [place],  [place] [address] PCP: Merri Brunette, MD  Subjective: Chief Complaint  Patient presents with  . Lower Back - Pain    Pain across lower back x months. Occasional muscle spasm anterior thighs with walking. No numbness/tingling.    HPI: She is here with low back pain.  Symptoms started a couple months ago, no definite injury.  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  It bothers her when transitioning from lying to standing or when walking a long time.  It feels better when she sits and rests.  Denies any bowel or bladder dysfunction.  She has had a little bit of radiation into her thighs at times.  She does not take medication for her pain.  No family history of back problems.                ROS:   All other systems were reviewed and are negative.  Objective: Vital Signs: There were no vitals taken for this visit.  Physical Exam:  General:  Alert and oriented, in no acute distress. Pulm:  Breathing unlabored. Psy:  Normal mood, congruent affect. Skin: No rash Low back: She is tender in the midline lumbosacral spine from L2-S1.  There is a little tenderness over the right greater trochanter.  No significant pain in the sciatic notch, negative straight leg raise, lower extremity strength and reflexes are normal.  Stork test is negative.    Imaging: XR Lumbar Spine 2-3 Views  Result Date: 01/05/2021 X-rays lumbar spine reveal moderate L5-S1 degenerative disc disease.  There is mild to moderate facet arthropathy at the lower levels.  No sign of compression fracture or neoplasm.  Hip joints look good.   Assessment & Plan: 1.  Low back pain possibly  due to facet arthropathy versus degenerative disc disease.  Neurologic exam is nonfocal. -We will try meloxicam and baclofen as needed.  Physical therapy referral.  Follow-up if symptoms persist.     Procedures: No procedures performed        PMFS History: Patient Active Problem List   Diagnosis Date Noted  . Right hand pain 01/30/2017  . Hypertension   . GERD (gastroesophageal reflux disease)    Past Medical History:  Diagnosis Date  . Asthma   . GERD (gastroesophageal reflux disease)   . Hypertension     Family History  Problem Relation Age of Onset  . Hypertension Mother   . Hyperlipidemia Mother   . Breast cancer Neg Hx     Past Surgical History:  Procedure Laterality Date  . NO PAST SURGERIES    . TUBAL LIGATION     Social History   Occupational History  . Not on file  Tobacco Use  . Smoking status: Never Smoker  . Smokeless tobacco: Never Used  Vaping Use  . Vaping Use: Never used  Substance and Sexual Activity  . Alcohol use: Yes    Comment: occassional  . Drug use: No  . Sexual activity: Not on file

## 2021-03-14 ENCOUNTER — Ambulatory Visit (HOSPITAL_COMMUNITY)
Admission: EM | Admit: 2021-03-14 | Discharge: 2021-03-14 | Disposition: A | Discharge status: Home / Self Care | Payer: No Typology Code available for payment source | Attending: Emergency Medicine | Admitting: Emergency Medicine

## 2021-03-14 ENCOUNTER — Ambulatory Visit (INDEPENDENT_AMBULATORY_CARE_PROVIDER_SITE_OTHER): Payer: No Typology Code available for payment source

## 2021-03-14 ENCOUNTER — Other Ambulatory Visit: Payer: Self-pay

## 2021-03-14 DIAGNOSIS — W19XXXA Unspecified fall, initial encounter: Secondary | ICD-10-CM | POA: Diagnosis not present

## 2021-03-14 DIAGNOSIS — S301XXA Contusion of abdominal wall, initial encounter: Secondary | ICD-10-CM | POA: Diagnosis not present

## 2021-03-14 DIAGNOSIS — M25522 Pain in left elbow: Secondary | ICD-10-CM | POA: Diagnosis not present

## 2021-03-14 DIAGNOSIS — S5002XA Contusion of left elbow, initial encounter: Secondary | ICD-10-CM

## 2021-03-14 NOTE — Discharge Instructions (Addendum)
Your elbow x-ray is negative for fracture.  I believe you have sprained or bruised your elbow.  Your abdomen is also not very concerning for appendicitis at this time.  However, if it changes, go immediately to the emergency department.  May take 1000 mg of Tylenol 3-4 times a day as needed for pain.  Take the Mobic that is prescribed to you once in the morning.  This is a very effective combination for pain.  Follow-up with occupational health in 2 to 3 days if not getting any better.

## 2021-03-14 NOTE — ED Triage Notes (Signed)
Pt present a fall on Friday at work and injured her left arm and right side pain.

## 2021-03-14 NOTE — ED Provider Notes (Signed)
HPI  SUBJECTIVE:  Christy Schultz is a 55 y.o. female who presents with 3 days of left elbow and right lower quadrant pain after having a trip and fall while at work.  Patient states that she was chasing a child and fell.  Unsure exactly how she landed, thinks that she may have landed on her knees and subsequently on her elbows.  She states that her shoulder, forearm, wrist and hand are without injury.  She denies bruising, swelling, erythema, limitation of motion of the elbow.  No distal numbness or tingling, grip weakness.  She has tried 1000 mg of Tylenol twice daily with improvement in her elbow pain.  Symptoms are worse with movement.  Second, she describes sharp and dull intermittent right lower quadrant pain, lasting 30 minutes up to an hour over the past 3 days. It Does not migrate or radiate.  Unsure if she sustained trauma to the abdomen in the fall.  No urinary symptoms, vaginal bleeding, odor, discharge.  She states it occurs mostly at night.  It does not occur with movement.  No nausea, vomiting, diarrhea, fevers, abdominal distention, bruising, anorexia.  She had a normal bowel movement this morning.  States that the car ride over here was not painful.  She has a past medical history of hypertension, asthma, no history of osteoporosis.  She is postmenopausal.  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  PMD: Merri Brunette, MD     Past Medical History:  Diagnosis Date  . Asthma   . GERD (gastroesophageal reflux disease)   . Hypertension     Past Surgical History:  Procedure Laterality Date  . NO PAST SURGERIES    . TUBAL LIGATION      Family History  Problem Relation Age of Onset  . Hypertension Mother   . Hyperlipidemia Mother   . Breast cancer Neg Hx     Social History   Tobacco Use  . Smoking status: Never Smoker  . Smokeless tobacco: Never Used  Vaping Use  . Vaping Use: Never used  Substance Use Topics  . Alcohol use: Yes    Comment: occassional  . Drug use: No     No current facility-administered medications for this encounter.  Current Outpatient Medications:  .  albuterol (VENTOLIN HFA) 108 (90 Base) MCG/ACT inhaler, albuterol sulfate HFA 90 mcg/actuation aerosol inhaler  2 PUFFS AS NEEDED EVERY 4 HRS INHALATION 25, Disp: , Rfl:  .  aspirin 81 MG chewable tablet, Chew 81 mg by mouth daily., Disp: , Rfl:  .  baclofen (LIORESAL) 10 MG tablet, Take 0.5-1 tablets (5-10 mg total) by mouth 3 (three) times daily as needed for muscle spasms., Disp: 30 each, Rfl: 3 .  Cetirizine HCl (ZYRTEC ALLERGY) 10 MG CAPS, Take 1 capsule by mouth as needed. , Disp: , Rfl:  .  Ciprofloxacin HCl 0.2 % otic solution, as needed., Disp: , Rfl:  .  LINZESS 72 MCG capsule, Take 72 mcg by mouth every morning., Disp: , Rfl:  .  lisinopril-hydrochlorothiazide (PRINZIDE,ZESTORETIC) 20-12.5 MG per tablet, Take 1 tablet by mouth daily., Disp: , Rfl:  .  meloxicam (MOBIC) 15 MG tablet, Take 0.5-1 tablets (7.5-15 mg total) by mouth daily as needed for pain., Disp: 30 tablet, Rfl: 6 .  omeprazole (PRILOSEC) 20 MG capsule, Take 20 mg by mouth daily., Disp: , Rfl:  .  valACYclovir (VALTREX) 500 MG tablet, Take 500 mg by mouth daily., Disp: , Rfl:   No Known Allergies   ROS  As noted in HPI.   Physical Exam  BP 103/80 (BP Location: Right Arm)   Pulse 87   Temp 98.5 F (36.9 C) (Oral)   Resp 16   SpO2 100%   Constitutional: Well developed, well nourished, no acute distress Eyes:  EOMI, conjunctiva normal bilaterally HENT: Normocephalic, atraumatic,mucus membranes moist Respiratory: Normal inspiratory effort Cardiovascular: Normal rate GI: Normal appearance, soft, mild right lower quadrant tenderness, nondistended, no guarding, rebound.  Negative Rovsing, negative obturator sign.  Negative tap table test.  Active bowel sounds. skin: No rash, skin intact Musculoskeletal: no deformities. L Elbow ROM Normal for Pt, Supracondylar region NT , Radial head NT , Olecrenon process   tender, Medial epicondyle tender, Lateral epicondyle NT,  Shoulder NT, forearm nontender, wrist NT, Hand NT with radial pulse intact, Sensation LT and Motor intact distally in distribution of radial, median, and ulnar nerve function. Neurologic: Alert & oriented x 3, no focal neuro deficits Psychiatric: Speech and behavior appropriate   ED Course   Medications - No data to display  Orders Placed This Encounter  Procedures  . DG Elbow Complete Left    Standing Status:   Standing    Number of Occurrences:   1    Order Specific Question:   Reason for Exam (SYMPTOM  OR DIAGNOSIS REQUIRED)    Answer:   Medial epicondyle and olecranon tenderness rule out fracture    No results found for this or any previous visit (from the past 24 hour(s)). DG Elbow Complete Left  Result Date: 03/14/2021 CLINICAL DATA:  Elbow pain.  Fall. EXAM: LEFT ELBOW - COMPLETE 3+ VIEW COMPARISON:  None. FINDINGS: There is no evidence of fracture, dislocation, or joint effusion. There is no evidence of arthropathy or other focal bone abnormality. Soft tissues are unremarkable. IMPRESSION: Negative. Electronically Signed   By: Charlett Nose M.D.   On: 03/14/2021 18:28    ED Clinical Impression  1. Contusion of left elbow, initial encounter   2. Contusion of abdominal wall, initial encounter      ED Assessment/Plan  1.  Left elbow pain.  She has medial epicondyle and olecranon tenderness.  Will image elbow to rule out chip fracture as it would change management.  Sensation over entire elbow and distally intact.  Ice, Tylenol/Mobic that is already prescribed to her  Reviewed imaging independently.  Normal elbow.  See radiology report for full details.  Plan as above.  2.  Right lower quadrant pain.  Most likely from the fall.  Patient may have sustained some mild trauma to the abdomen.  Her abdomen is benign.  Doubt appendicitis, obstruction, UTI, GU infection, ovarian torsion, nephrolithiasis, perforation.  Will  treat as an abdominal wall contusion with Tylenol/Mobic.  Follow-up with occupational health in 3 days if not getting any better.  ER return precautions given.  Discussed  imaging, MDM, treatment plan, and plan for follow-up with patient. Discussed sn/sx that should prompt return to the ED. patient agrees with plan.   No orders of the defined types were placed in this encounter.   *This clinic note was created using Dragon dictation software. Therefore, there may be occasional mistakes despite careful proofreading.   ?    Domenick Gong, MD 03/15/21 [id number]

## 2021-09-28 ENCOUNTER — Other Ambulatory Visit: Payer: Self-pay | Admitting: Internal Medicine

## 2021-10-07 ENCOUNTER — Other Ambulatory Visit: Payer: Self-pay | Admitting: Obstetrics and Gynecology

## 2021-10-07 DIAGNOSIS — N644 Mastodynia: Secondary | ICD-10-CM

## 2021-10-28 ENCOUNTER — Ambulatory Visit: Payer: BC Managed Care – PPO

## 2021-10-28 ENCOUNTER — Ambulatory Visit
Admission: RE | Admit: 2021-10-28 | Discharge: 2021-10-28 | Disposition: A | Discharge status: Home / Self Care | Payer: BC Managed Care – PPO | Source: Ambulatory Visit | Attending: Obstetrics and Gynecology | Admitting: Obstetrics and Gynecology

## 2021-10-28 DIAGNOSIS — N644 Mastodynia: Secondary | ICD-10-CM

## 2022-05-18 ENCOUNTER — Other Ambulatory Visit: Payer: Self-pay | Admitting: Registered Nurse

## 2022-05-18 DIAGNOSIS — E785 Hyperlipidemia, unspecified: Secondary | ICD-10-CM

## 2022-07-06 ENCOUNTER — Ambulatory Visit
Admission: RE | Admit: 2022-07-06 | Discharge: 2022-07-06 | Disposition: A | Discharge status: Home / Self Care | Payer: No Typology Code available for payment source | Source: Ambulatory Visit | Attending: Registered Nurse | Admitting: Registered Nurse

## 2022-07-06 DIAGNOSIS — E785 Hyperlipidemia, unspecified: Secondary | ICD-10-CM

## 2022-08-22 ENCOUNTER — Ambulatory Visit (HOSPITAL_COMMUNITY)
Admission: EM | Admit: 2022-08-22 | Discharge: 2022-08-22 | Disposition: A | Discharge status: Home / Self Care | Payer: BC Managed Care – PPO | Attending: Internal Medicine | Admitting: Internal Medicine

## 2022-08-22 ENCOUNTER — Other Ambulatory Visit: Payer: Self-pay

## 2022-08-22 ENCOUNTER — Encounter (HOSPITAL_COMMUNITY): Payer: Self-pay | Admitting: *Deleted

## 2022-08-22 DIAGNOSIS — L03116 Cellulitis of left lower limb: Secondary | ICD-10-CM | POA: Diagnosis not present

## 2022-08-22 MED ORDER — CEPHALEXIN 500 MG PO CAPS: 500.0000 mg | ORAL_CAPSULE | Freq: Three times a day (TID) | ORAL | 0 refills | Status: AC

## 2022-08-22 NOTE — ED Triage Notes (Signed)
PT reports a insect bite on Lt lower leg. Small white fluid filled bump observed on LT lower leg . Surrounding skin red.

## 2022-08-22 NOTE — ED Provider Notes (Addendum)
[facility]    CSN: [id number] Arrival date & time: 08/22/22  1708         History   Chief Complaint Chief Complaint  Patient presents with   Insect Bite    HPI Christy Schultz is a 56 y.o. female comes to the urgent care with painful swelling of the left leg.  Symptoms started a few days ago and has been worsening.  It is associated with redness which is spreading up her leg.  No trauma or falls.  Patient does not shave her legs.  She thinks she may have been bitten by an insect.  No fever or chills.  No nausea or vomiting.  No cough tenderness.  HPI  Past Medical History:  Diagnosis Date   Asthma    GERD (gastroesophageal reflux disease)    Hypertension     Patient Active Problem List   Diagnosis Date Noted   Right hand pain 01/30/2017   Hypertension    GERD (gastroesophageal reflux disease)     Past Surgical History:  Procedure Laterality Date   NO PAST SURGERIES     TUBAL LIGATION      OB History   No obstetric history on file.      Home Medications    Prior to Admission medications   Medication Sig Start Date End Date Taking? Authorizing Provider  cephALEXin (KEFLEX) 500 MG capsule Take 1 capsule (500 mg total) by mouth 3 (three) times daily for 7 days. 08/22/22 08/29/22 Yes Jaylani Mcguinn, Britta Mccreedy, MD  albuterol (VENTOLIN HFA) 108 (90 Base) MCG/ACT inhaler albuterol sulfate HFA 90 mcg/actuation aerosol inhaler  2 PUFFS AS NEEDED EVERY 4 HRS INHALATION 25    [provider]  aspirin 81 MG chewable tablet Chew 81 mg by mouth daily.    [provider]  baclofen (LIORESAL) 10 MG tablet Take 0.5-1 tablets (5-10 mg total) by mouth 3 (three) times daily as needed for muscle spasms. 01/05/21   Hilts, Casimiro Needle, MD  Cetirizine HCl (ZYRTEC ALLERGY) 10 MG CAPS Take 1 capsule by mouth as needed.     [provider]  Ciprofloxacin HCl 0.2 % otic solution as needed.    [provider]  LINZESS 72 MCG capsule Take 72  mcg by mouth every morning. 11/25/20   [provider]  lisinopril-hydrochlorothiazide (PRINZIDE,ZESTORETIC) 20-12.5 MG per tablet Take 1 tablet by mouth daily.    [provider]  meloxicam (MOBIC) 15 MG tablet Take 0.5-1 tablets (7.5-15 mg total) by mouth daily as needed for pain. 01/05/21   Hilts, Casimiro Needle, MD  omeprazole (PRILOSEC) 20 MG capsule Take 20 mg by mouth daily.    [provider]  valACYclovir (VALTREX) 500 MG tablet Take 500 mg by mouth daily.    [provider]    Family History Family History  Problem Relation Age of Onset   Hypertension Mother    Hyperlipidemia Mother    Breast cancer Neg Hx     Social History Social History   Tobacco Use   Smoking status: Never   Smokeless tobacco: Never  Vaping Use   Vaping Use: Never used  Substance Use Topics   Alcohol use: Yes    Comment: occassional   Drug use: No     Allergies   Patient has no known allergies.   Review of Systems Review of Systems  Constitutional: Negative.   Gastrointestinal: Negative.   Musculoskeletal: Negative.   Skin:  Positive for color change and rash.  Neurological:  Negative.      Physical Exam Triage Vital Signs ED Triage Vitals  Enc Vitals Group     BP 08/22/22 1802 121/82     Pulse Rate 08/22/22 1802 97     Resp 08/22/22 1802 18     Temp 08/22/22 1802 98.4 F (36.9 C)     Temp src --      SpO2 08/22/22 1802 100 %     Weight --      Height --      Head Circumference --      Peak Flow --      Pain Score 08/22/22 1800 8     Pain Loc --      Pain Edu? --      Excl. in GC? --    No data found.  Updated Vital Signs BP 121/82   Pulse 97   Temp 98.4 F (36.9 C)   Resp 18   SpO2 100%   Visual Acuity Right Eye Distance:   Left Eye Distance:   Bilateral Distance:    Right Eye Near:   Left Eye Near:    Bilateral Near:     Physical Exam Vitals and nursing note reviewed.  Constitutional:      Appearance: Normal appearance.   Cardiovascular:     Rate and Rhythm: Normal rate and regular rhythm.  Musculoskeletal:        General: Normal range of motion.  Skin:    Comments: Pustule on the left leg surrounded by erythema.  No calf tenderness.  Mild swelling in the left lower extremity around the ankle.  Neurological:     Mental Status: She is alert.      UC Treatments / Results  Labs (all labs ordered are listed, but only abnormal results are displayed) Labs Reviewed - No data to display  EKG   Radiology No results found.  Procedures Procedures (including critical care time)  Medications Ordered in UC Medications - No data to display  Initial Impression / Assessment and Plan / UC Course  I have reviewed the triage vital signs and the nursing notes.  Pertinent labs & imaging results that were available during my care of the patient were reviewed by me and considered in my medical decision making (see chart for details).     1.  Cellulitis of left lower leg: Keflex 500 mg 3 times daily for 7 days Warm compresses as needed Tylenol or ibuprofen as needed for pain Return precautions given. Final Clinical Impressions(s) / UC Diagnoses   Final diagnoses:  Cellulitis of left lower extremity     Discharge Instructions      Warm compresses as needed Please take medications as directed Elevation of the lower extremities Tylenol or ibuprofen as needed for pain. If you have worsening symptoms please return to urgent care for further evaluation   ED Prescriptions     Medication Sig Dispense Auth. Provider   cephALEXin (KEFLEX) 500 MG capsule Take 1 capsule (500 mg total) by mouth 3 (three) times daily for 7 days. 20 capsule Cambreigh Dearing, Britta Mccreedy, MD      PDMP not reviewed this encounter.   Merrilee Jansky, MD 08/22/22 Carlis Stable    Merrilee Jansky, MD 08/22/22 [phone]

## 2022-08-22 NOTE — Discharge Instructions (Addendum)
Warm compresses as needed Please take medications as directed Elevation of the lower extremities Tylenol or ibuprofen as needed for pain. If you have worsening symptoms please return to urgent care for further evaluation

## 2022-09-22 ENCOUNTER — Other Ambulatory Visit (HOSPITAL_COMMUNITY): Payer: Self-pay

## 2022-09-22 MED ORDER — OZEMPIC (1 MG/DOSE) 4 MG/3ML ~~LOC~~ SOPN
1.0000 mg | PEN_INJECTOR | SUBCUTANEOUS | 3 refills | Status: AC
  Filled 2022-09-22 – 2022-09-28 (×2): qty 3, 28d supply, fill #0
  Filled 2022-11-02: qty 3, 28d supply, fill #1

## 2022-09-27 ENCOUNTER — Other Ambulatory Visit: Payer: Self-pay | Admitting: Obstetrics and Gynecology

## 2022-09-27 ENCOUNTER — Other Ambulatory Visit: Payer: Self-pay | Admitting: Internal Medicine

## 2022-09-27 DIAGNOSIS — Z1231 Encounter for screening mammogram for malignant neoplasm of breast: Secondary | ICD-10-CM

## 2022-09-28 ENCOUNTER — Other Ambulatory Visit (HOSPITAL_COMMUNITY): Payer: Self-pay

## 2022-09-29 ENCOUNTER — Other Ambulatory Visit (HOSPITAL_COMMUNITY): Payer: Self-pay

## 2022-10-31 ENCOUNTER — Ambulatory Visit
Admission: RE | Admit: 2022-10-31 | Discharge: 2022-10-31 | Disposition: A | Discharge status: Home / Self Care | Payer: BC Managed Care – PPO | Source: Ambulatory Visit | Attending: Obstetrics and Gynecology | Admitting: Obstetrics and Gynecology

## 2022-10-31 DIAGNOSIS — Z1231 Encounter for screening mammogram for malignant neoplasm of breast: Secondary | ICD-10-CM

## 2022-11-02 ENCOUNTER — Other Ambulatory Visit (HOSPITAL_COMMUNITY): Payer: Self-pay

## 2022-11-14 ENCOUNTER — Other Ambulatory Visit (HOSPITAL_COMMUNITY): Payer: Self-pay

## 2023-09-25 ENCOUNTER — Encounter: Payer: Self-pay | Admitting: Podiatry

## 2023-09-25 ENCOUNTER — Ambulatory Visit: Payer: BC Managed Care – PPO | Admitting: Podiatry

## 2023-09-25 DIAGNOSIS — S90211A Contusion of right great toe with damage to nail, initial encounter: Secondary | ICD-10-CM

## 2023-09-25 NOTE — Progress Notes (Signed)
Subjective:   Patient ID: Christy Schultz, female   DOB: 57 y.o.   MRN: [id number]   HPI Patient presents stating she is a diabetic and was scared by a small spot on her right big toenail that it could be related to diabetes.  Patient does not smoke her A1c was high but is now down to 9 and she continues to work on   Review of Systems  All other systems reviewed and are negative.       Objective:  Physical Exam Vitals and nursing note reviewed.  Constitutional:      Appearance: She is well-developed.  Pulmonary:     Effort: Pulmonary effort is normal.  Musculoskeletal:        General: Normal range of motion.  Skin:    General: Skin is warm.  Neurological:     Mental Status: She is alert.     Neurovascular status intact muscle strength was found to be adequate range of motion adequate with a small blood spot on the right big toe lateral side localized     Assessment:  Traumatized right hallux lateral side does not appear to be anything else pathological     Plan:  Reviewed condition recommended letting it grow out it should be uneventful if any issues were to occur for were to change she is to let us know as its only been present several weeks

## 2023-10-03 ENCOUNTER — Other Ambulatory Visit: Payer: Self-pay | Admitting: Obstetrics and Gynecology

## 2023-10-03 DIAGNOSIS — Z1231 Encounter for screening mammogram for malignant neoplasm of breast: Secondary | ICD-10-CM

## 2023-10-09 ENCOUNTER — Other Ambulatory Visit: Payer: Self-pay | Admitting: Obstetrics and Gynecology

## 2023-10-09 DIAGNOSIS — N644 Mastodynia: Secondary | ICD-10-CM

## 2023-10-24 ENCOUNTER — Ambulatory Visit
Admission: RE | Admit: 2023-10-24 | Discharge: 2023-10-24 | Disposition: A | Discharge status: Home / Self Care | Payer: BC Managed Care – PPO | Source: Ambulatory Visit | Attending: Obstetrics and Gynecology | Admitting: Obstetrics and Gynecology

## 2023-10-24 ENCOUNTER — Ambulatory Visit: Payer: BC Managed Care – PPO

## 2023-10-24 DIAGNOSIS — N644 Mastodynia: Secondary | ICD-10-CM

## 2024-01-01 ENCOUNTER — Ambulatory Visit
Admission: RE | Admit: 2024-01-01 | Discharge: 2024-01-01 | Disposition: A | Discharge status: Home / Self Care | Payer: 59 | Source: Ambulatory Visit | Attending: Obstetrics and Gynecology | Admitting: Obstetrics and Gynecology

## 2024-01-01 DIAGNOSIS — Z1231 Encounter for screening mammogram for malignant neoplasm of breast: Secondary | ICD-10-CM

## 2024-04-25 ENCOUNTER — Encounter: Payer: Self-pay | Admitting: Emergency Medicine

## 2024-04-25 ENCOUNTER — Ambulatory Visit
Admission: EM | Admit: 2024-04-25 | Discharge: 2024-04-25 | Disposition: A | Discharge status: Home / Self Care | Payer: Self-pay

## 2024-04-25 DIAGNOSIS — M5431 Sciatica, right side: Secondary | ICD-10-CM

## 2024-04-25 MED ORDER — DICLOFENAC SODIUM 50 MG PO TBEC: 50.0000 mg | DELAYED_RELEASE_TABLET | Freq: Two times a day (BID) | ORAL | 1 refills | Status: AC

## 2024-04-25 MED ORDER — BACLOFEN 20 MG PO TABS: 20.0000 mg | ORAL_TABLET | Freq: Three times a day (TID) | ORAL | 0 refills | Status: AC

## 2024-04-25 NOTE — Discharge Instructions (Addendum)
  1. Sciatica of right side (Primary) - diclofenac  (VOLTAREN ) 50 MG EC tablet; Take 1 tablet (50 mg total) by mouth 2 (two) times daily.  Dispense: 30 tablet; Refill: 1 - baclofen  (LIORESAL ) 20 MG tablet; Take 1 tablet (20 mg total) by mouth 3 (three) times daily.  Dispense: 30 each; Refill: 0 - Apply ice 2-3 times a day for 10 to 15 minutes at a time directly to the area of discomfort to decrease inflammation and pain - Continue to monitor symptoms for any change in severity if there is any escalation of current symptoms or development of new symptoms follow-up in ER for further evaluation of possible need for advanced imaging.
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
## 2024-05-29 ENCOUNTER — Other Ambulatory Visit: Payer: Self-pay | Admitting: Registered Nurse

## 2024-05-29 DIAGNOSIS — E010 Iodine-deficiency related diffuse (endemic) goiter: Secondary | ICD-10-CM

## 2024-05-30 ENCOUNTER — Encounter: Payer: Self-pay | Admitting: Registered Nurse

## 2024-05-31 ENCOUNTER — Other Ambulatory Visit: Payer: Self-pay

## 2024-06-04 ENCOUNTER — Ambulatory Visit
Admission: RE | Admit: 2024-06-04 | Discharge: 2024-06-04 | Disposition: A | Discharge status: Home / Self Care | Payer: Self-pay | Source: Ambulatory Visit | Attending: Registered Nurse | Admitting: Registered Nurse

## 2024-06-04 DIAGNOSIS — E010 Iodine-deficiency related diffuse (endemic) goiter: Secondary | ICD-10-CM

## 2024-06-07 NOTE — Progress Notes (Deleted)
 Ben Jackson [facility] [address] [place] [address] Phone: [phone]   Assessment and Plan:     There are no diagnoses linked to this encounter.  ***   Pertinent previous records reviewed include ***    Follow Up: ***     Subjective:    Chief Complaint: ***  HPI:   06/10/24 Patient is a 58 year old female with complaints of R low back pain with radiculopathy. Patient states  Duration? Did you have an Injury to cause this pain? Taking Medication for pain? Numbness or Tingling? Does the pain Radiate?  Altered gait or use? ROM/ impairment of movement?   Relevant Historical Information: ***  Additional pertinent review of systems negative.   Current Outpatient Medications:    albuterol (VENTOLIN HFA) 108 (90 Base) MCG/ACT inhaler, albuterol sulfate HFA 90 mcg/actuation aerosol inhaler  2 PUFFS AS NEEDED EVERY 4 HRS INHALATION 25, Disp: , Rfl:    aspirin 81 MG chewable tablet, Chew 81 mg by mouth daily., Disp: , Rfl:    baclofen  (LIORESAL ) 20 MG tablet, Take 1 tablet (20 mg total) by mouth 3 (three) times daily., Disp: 30 each, Rfl: 0   Cetirizine HCl (ZYRTEC ALLERGY) 10 MG CAPS, Take 1 capsule by mouth as needed. , Disp: , Rfl:    Ciprofloxacin HCl 0.2 % otic solution, as needed., Disp: , Rfl:    diclofenac  (VOLTAREN ) 50 MG EC tablet, Take 1 tablet (50 mg total) by mouth 2 (two) times daily., Disp: 30 tablet, Rfl: 1   LINZESS 72 MCG capsule, Take 72 mcg by mouth every morning., Disp: , Rfl:    lisinopril-hydrochlorothiazide (PRINZIDE,ZESTORETIC) 20-12.5 MG per tablet, Take 1 tablet by mouth daily., Disp: , Rfl:    meloxicam  (MOBIC ) 15 MG tablet, Take 0.5-1 tablets (7.5-15 mg total) by mouth daily as needed for pain., Disp: 30 tablet, Rfl: 6   omeprazole (PRILOSEC) 20 MG capsule, Take 20 mg by mouth daily., Disp: , Rfl:    Semaglutide , 1 MG/DOSE, (OZEMPIC , 1 MG/DOSE,) 4 MG/3ML SOPN, Inject 1 mg into the skin once a  week., Disp: 3 mL, Rfl: 3   valACYclovir (VALTREX) 500 MG tablet, Take 500 mg by mouth daily., Disp: , Rfl:    Objective:     There were no vitals filed for this visit.    There is no height or weight on file to calculate BMI.    Physical Exam:    ***   Electronically signed by:  Odis Mace [facility] 8:15 AM 06/10/24               Odis Mace [facility] [address] [place] [address] Phone: [phone]   Assessment and Plan:     There are no diagnoses linked to this encounter.  ***   Pertinent previous records reviewed include ***    Follow Up: ***     Subjective:    Chief Complaint: right low back pain with radiculopathy  HPI:   06/10/2024 Patient is a 58 year old female with the complaint of right low back pain with radiculopathy. Patient states  Duration? Did you have an Injury to cause this pain? Taking Medication for pain? Numbness or Tingling? Does the pain Radiate?  Altered gait or use? ROM/ impairment of movement?  Relevant Historical Information: ***  Additional pertinent review of systems negative.   Current Outpatient Medications:    albuterol (VENTOLIN HFA) 108 (90  Base) MCG/ACT inhaler, albuterol sulfate HFA 90 mcg/actuation aerosol inhaler  2 PUFFS AS NEEDED EVERY 4 HRS INHALATION 25, Disp: , Rfl:    aspirin 81 MG chewable tablet, Chew 81 mg by mouth daily., Disp: , Rfl:    baclofen  (LIORESAL ) 20 MG tablet, Take 1 tablet (20 mg total) by mouth 3 (three) times daily., Disp: 30 each, Rfl: 0   Cetirizine HCl (ZYRTEC ALLERGY) 10 MG CAPS, Take 1 capsule by mouth as needed. , Disp: , Rfl:    Ciprofloxacin HCl 0.2 % otic solution, as needed., Disp: , Rfl:    diclofenac  (VOLTAREN ) 50 MG EC tablet, Take 1 tablet (50 mg total) by mouth 2 (two) times daily., Disp: 30 tablet, Rfl: 1   LINZESS 72 MCG capsule, Take 72 mcg by mouth every morning., Disp: , Rfl:    lisinopril-hydrochlorothiazide  (PRINZIDE,ZESTORETIC) 20-12.5 MG per tablet, Take 1 tablet by mouth daily., Disp: , Rfl:    meloxicam  (MOBIC ) 15 MG tablet, Take 0.5-1 tablets (7.5-15 mg total) by mouth daily as needed for pain., Disp: 30 tablet, Rfl: 6   omeprazole (PRILOSEC) 20 MG capsule, Take 20 mg by mouth daily., Disp: , Rfl:    Semaglutide , 1 MG/DOSE, (OZEMPIC , 1 MG/DOSE,) 4 MG/3ML SOPN, Inject 1 mg into the skin once a week., Disp: 3 mL, Rfl: 3   valACYclovir (VALTREX) 500 MG tablet, Take 500 mg by mouth daily., Disp: , Rfl:    Objective:     There were no vitals filed for this visit.    There is no height or weight on file to calculate BMI.    Physical Exam:    ***   Electronically signed by:  Odis Mace [facility] 11:26 AM 06/07/24

## 2024-06-10 ENCOUNTER — Ambulatory Visit: Admitting: Sports Medicine

## 2024-06-18 ENCOUNTER — Ambulatory Visit: Admitting: Family Medicine

## 2024-06-18 ENCOUNTER — Ambulatory Visit (INDEPENDENT_AMBULATORY_CARE_PROVIDER_SITE_OTHER)

## 2024-06-18 VITALS — BP 102/74 | HR 103 | Wt 201.0 lb

## 2024-06-18 DIAGNOSIS — G8929 Other chronic pain: Secondary | ICD-10-CM

## 2024-06-18 DIAGNOSIS — M5416 Radiculopathy, lumbar region: Secondary | ICD-10-CM | POA: Diagnosis not present

## 2024-06-18 DIAGNOSIS — R29898 Other symptoms and signs involving the musculoskeletal system: Secondary | ICD-10-CM

## 2024-06-18 DIAGNOSIS — M5441 Lumbago with sciatica, right side: Secondary | ICD-10-CM

## 2024-06-18 MED ORDER — PREDNISONE 50 MG PO TABS: ORAL_TABLET | ORAL | 0 refills | Status: AC

## 2024-06-18 MED ORDER — GABAPENTIN 100 MG PO CAPS: 100.0000 mg | ORAL_CAPSULE | Freq: Three times a day (TID) | ORAL | 3 refills | Status: AC | PRN
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
## 2024-06-18 NOTE — Patient Instructions (Addendum)
 Thank you for coming in today.   Please get an Xray today before you leave   You should hear from MRI scheduling within 1 week. If you do not hear please let me know.    I've referred you to Physical Therapy.  Let us  know if you don't hear from them in one week.   I've sent a prescription for Prednisone & Gabapentin to your pharmacy.   Check back after we get the MRI results back

## 2024-06-24 ENCOUNTER — Ambulatory Visit: Payer: Self-pay | Admitting: Family Medicine

## 2024-06-24 NOTE — Progress Notes (Signed)
 Low back x-ray shows medium arthritis

## 2025-01-08 ENCOUNTER — Other Ambulatory Visit: Payer: Self-pay | Admitting: Obstetrics and Gynecology

## 2025-01-08 DIAGNOSIS — Z1231 Encounter for screening mammogram for malignant neoplasm of breast: Secondary | ICD-10-CM

## 2025-01-27 ENCOUNTER — Ambulatory Visit
